# Patient Record
Sex: Female | Born: 1970 | Race: Asian | Hispanic: No | Marital: Single | Smoking: Never smoker
Health system: Southern US, Community
[De-identification: ages and names within clinical notes are randomized; demographics above are authoritative.]

## PROBLEM LIST (undated history)

## (undated) DIAGNOSIS — E7800 Pure hypercholesterolemia, unspecified: Secondary | ICD-10-CM

## (undated) DIAGNOSIS — N979 Female infertility, unspecified: Secondary | ICD-10-CM

## (undated) DIAGNOSIS — N939 Abnormal uterine and vaginal bleeding, unspecified: Secondary | ICD-10-CM

## (undated) DIAGNOSIS — D219 Benign neoplasm of connective and other soft tissue, unspecified: Secondary | ICD-10-CM

## (undated) HISTORY — DX: Female infertility, unspecified: N97.9

## (undated) HISTORY — DX: Abnormal uterine and vaginal bleeding, unspecified: N93.9

## (undated) HISTORY — DX: Pure hypercholesterolemia, unspecified: E78.00

## (undated) HISTORY — DX: Benign neoplasm of connective and other soft tissue, unspecified: D21.9

---

## 2011-11-25 DIAGNOSIS — E7800 Pure hypercholesterolemia, unspecified: Secondary | ICD-10-CM

## 2011-11-25 HISTORY — DX: Pure hypercholesterolemia, unspecified: E78.00

## 2013-08-08 ENCOUNTER — Encounter: Payer: Self-pay | Admitting: Obstetrics and Gynecology

## 2013-08-08 ENCOUNTER — Ambulatory Visit (INDEPENDENT_AMBULATORY_CARE_PROVIDER_SITE_OTHER): Payer: BC Managed Care – PPO | Admitting: Obstetrics and Gynecology

## 2013-08-08 ENCOUNTER — Other Ambulatory Visit: Payer: Self-pay | Admitting: Obstetrics and Gynecology

## 2013-08-08 VITALS — BP 100/60 | HR 76 | Resp 18 | Ht 59.5 in | Wt 108.0 lb

## 2013-08-08 DIAGNOSIS — Z0000 Encounter for general adult medical examination without abnormal findings: Secondary | ICD-10-CM

## 2013-08-08 DIAGNOSIS — Z1231 Encounter for screening mammogram for malignant neoplasm of breast: Secondary | ICD-10-CM

## 2013-08-08 DIAGNOSIS — Z01419 Encounter for gynecological examination (general) (routine) without abnormal findings: Secondary | ICD-10-CM

## 2013-08-08 DIAGNOSIS — N926 Irregular menstruation, unspecified: Secondary | ICD-10-CM

## 2013-08-08 DIAGNOSIS — N939 Abnormal uterine and vaginal bleeding, unspecified: Secondary | ICD-10-CM

## 2013-08-08 LAB — POCT URINALYSIS DIPSTICK
Bilirubin, UA: NEGATIVE
Blood, UA: NEGATIVE
Glucose, UA: NEGATIVE
Nitrite, UA: NEGATIVE

## 2013-08-08 LAB — CBC
HCT: 42.7 % (ref 36.0–46.0)
RDW: 12.8 % (ref 11.5–15.5)
WBC: 9 10*3/uL (ref 4.0–10.5)

## 2013-08-08 NOTE — Progress Notes (Signed)
42 y.o.   Single [place] female   G0P0   here for annual exam.   Bleeding in between periods for more than one year.   Can occur one to two weeks after menses. Menses can occur every 27 - 37 days. Saw a provider in [facility] January 2013 had an ultrasound, and it was normal.  Received an Rx for a medication, one time dose which was repeated 7 days later. Bleeding has still recurred. Denies pain with menses. Sexually active.  Not trying for pregnancy.  Not preventing pregnancy.  Denies pain with intercourse.  Steady partner for 5 years.     Patient's last menstrual period was 07/11/2013.          Sexually active: yes  The current method of family planning is none.    Exercising: not now Last mammogram:  11/2011 normal Last pap smear: 11/2011 neg History of abnormal pap: none Smoking:never Alcohol:no Last colonoscopy: never Last Bone Density:  never Last tetanus shot: not sure Last cholesterol check: 11/2011 slightly  Elevated.  Will recheck in [place] in one month.  Sister is a physician there.  Urine: neg   Family History  Problem Relation Age of Onset  . Diabetes Mother   . Hyperlipidemia Mother   . Hypertension Mother   . Rheum arthritis Mother   . Hyperlipidemia Father     There are no active problems to display for this patient.   Past Medical History  Diagnosis Date  . Abnormal uterine bleeding   . Infertility, female   . Elevated cholesterol 11/2011    History reviewed. No pertinent past surgical history.  Allergies: Review of patient's allergies indicates no known allergies.  Current Outpatient Prescriptions  Medication Sig Dispense Refill  . Acetaminophen (TYLENOL PO) Take by mouth as needed (for headaches).       No current facility-administered medications for this visit.    ROS: Pertinent items are noted in HPI.  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  Will be gone for one month.  Exam:    BP 100/60  Pulse  76  Resp 18  Ht 4' 11.5" (1.511 m)  Wt 108 lb (48.988 kg)  BMI 21.46 kg/m2  LMP 07/11/2013   Wt Readings from Last 3 Encounters:  08/08/13 108 lb (48.988 kg)     Ht Readings from Last 3 Encounters:  08/08/13 4' 11.5" (1.511 m)    General appearance: alert, cooperative and appears stated age Head: Normocephalic, without obvious abnormality, atraumatic Neck: no adenopathy, supple, symmetrical, trachea midline and thyroid not enlarged, symmetric, no tenderness/mass/nodules Lungs: clear to auscultation bilaterally Breasts: Inspection negative, No nipple retraction or dimpling, No nipple discharge or bleeding, No axillary or supraclavicular adenopathy, Normal to palpation without dominant masses Heart: regular rate and rhythm Abdomen: soft, non-tender; bowel sounds normal; no masses,  no organomegaly Extremities: extremities normal, atraumatic, no cyanosis or edema Skin: Skin color, texture, turgor normal. No rashes or lesions Lymph nodes: Cervical, supraclavicular, and axillary nodes normal. No abnormal inguinal nodes palpated Neurologic: Grossly normal   Pelvic: External genitalia:  no lesions              Urethra:  normal appearing urethra with no masses, tenderness or lesions              Bartholins and Skenes: normal                 Vagina: normal appearing vagina with normal color and  discharge, no lesions              Cervix: normal appearance              Pap taken: yes        Bimanual Exam:  Uterus:  uterus is normal size, shape, consistency and nontender                                      Adnexa: normal adnexa in size, nontender and no masses                                      Rectovaginal: Confirms                                      Anus:  normal sphincter tone, no lesions  Assessment  Chronic abnormal uterine bleeding History of infertility.    Planning      [facility] and high risk HPV testing. GC/CT TSH and CBC  today. Return for pelvic ultrasound, sonohysterogram, and endometrial biopsy. return annually or prn     An After Visit Summary was printed and given to the patient.

## 2013-08-08 NOTE — Patient Instructions (Signed)

## 2013-08-11 LAB — IPS N GONORRHOEA AND CHLAMYDIA BY PCR

## 2013-08-18 ENCOUNTER — Encounter: Payer: Self-pay | Admitting: Obstetrics and Gynecology

## 2013-08-18 ENCOUNTER — Ambulatory Visit (INDEPENDENT_AMBULATORY_CARE_PROVIDER_SITE_OTHER): Payer: BC Managed Care – PPO

## 2013-08-18 ENCOUNTER — Ambulatory Visit (INDEPENDENT_AMBULATORY_CARE_PROVIDER_SITE_OTHER): Payer: BC Managed Care – PPO | Admitting: Obstetrics and Gynecology

## 2013-08-18 VITALS — BP 120/70 | HR 60 | Ht 59.5 in | Wt 107.0 lb

## 2013-08-18 DIAGNOSIS — N923 Ovulation bleeding: Secondary | ICD-10-CM

## 2013-08-18 DIAGNOSIS — N939 Abnormal uterine and vaginal bleeding, unspecified: Secondary | ICD-10-CM

## 2013-08-18 DIAGNOSIS — N926 Irregular menstruation, unspecified: Secondary | ICD-10-CM

## 2013-08-18 DIAGNOSIS — N921 Excessive and frequent menstruation with irregular cycle: Secondary | ICD-10-CM

## 2013-08-18 NOTE — Progress Notes (Signed)
Patient ID: Kathryn Sherman, female   DOB: [birth year], 42 y.o.   MRN: [id number]  Subjective  LMP 08/13/13  Patient is here for pelvic ultrasound, sonohysterogram, and endometrial biopsy.  Has had intermenstrual bleeding for one year.   Had a prior normal pelvic ultrasound in [place], [place]  Patient traveling to [place] for one month, next week on 08/23/13.  Objective  See ultrasound below - EMS 6.85 mm with echogenic thickened areas with a feeder vessel suggestive of a polyp.  Ovaries WNL  No free fluid.  Sonohysterogram -   Consent performed.  Speculum placed.  Sterile prep of cervix with betadine.  Cannula placed.  Speculum withdrawn.  Saline injected without difficulty. Images show multiple filling defects, largest 7 mm, suspicious for polyps.  Cystic area noted on filling defect of the right cornual region.   EMB -  Consent performed.  Speculum placed.  Sterile prep of cervix with betadine.  Tenaculum to anterior cervical lip.  Pipelle placed to 8 and 1/2 cm, twice. Tissue obtained and sent to pathology.  Tenaculum and speculum removed.  No complications. Minimal EBL.  Assessment  Intermenstrual bleeding. Possible endometrial polyps.  Plan  Follow up of endometrial biopsy.   I discussed with the patient potential hysteroscopic polypectomy with dilation and curettage as an outpatient procedure.  ACOG handouts were given to the patient. She indicates understanding of our visit today.  Patient has signed a form allowing [place] to contact her friend to give her biopsy results while she is traveling to [place].

## 2013-08-22 ENCOUNTER — Ambulatory Visit
Admission: RE | Admit: 2013-08-22 | Discharge: 2013-08-22 | Disposition: A | Discharge status: Home / Self Care | Payer: BC Managed Care – PPO | Source: Ambulatory Visit | Attending: Obstetrics and Gynecology | Admitting: Obstetrics and Gynecology

## 2013-08-22 DIAGNOSIS — Z1231 Encounter for screening mammogram for malignant neoplasm of breast: Secondary | ICD-10-CM

## 2013-08-29 ENCOUNTER — Other Ambulatory Visit: Payer: Self-pay | Admitting: Obstetrics and Gynecology

## 2013-08-29 DIAGNOSIS — R928 Other abnormal and inconclusive findings on diagnostic imaging of breast: Secondary | ICD-10-CM

## 2013-09-01 ENCOUNTER — Encounter: Payer: Self-pay | Admitting: Obstetrics and Gynecology

## 2013-09-01 ENCOUNTER — Telehealth: Payer: Self-pay | Admitting: Obstetrics and Gynecology

## 2013-09-01 NOTE — Telephone Encounter (Signed)
I left a message for Kathryn Sherman to call back to receive test results for [facility].  Her endometrial biopsy showed a benign polyp and this will need further treatment.  Her screening mammogram showed calcifications of the left breast and will also need further evaluation.  Patient is out of the country for a month and gave written permission to give results to Kathryn Sherman.  Will also send her a letter.

## 2013-09-09 ENCOUNTER — Telehealth: Payer: Self-pay | Admitting: Obstetrics and Gynecology

## 2013-09-09 NOTE — Telephone Encounter (Signed)
Call Documentation    Kathryn Overly, MD at 09/01/2013 12:44 PM    Status: Signed             I left a message for Kathryn Sherman to call back to receive test results for Kathryn Sherman.  Her endometrial biopsy showed a benign polyp and this will need further treatment.  Her screening mammogram showed calcifications of the left breast and will also need further evaluation.  Patient is out of the country for a month and gave written permission to give results to Kathryn Sherman.  Will also send her a letter.

## 2013-09-09 NOTE — Telephone Encounter (Signed)
Message left to return call to Sharifa Bucholz at [phone].  Please give message below from Dr. Silva.   

## 2013-09-09 NOTE — Telephone Encounter (Signed)
Pt friend said he got a letter in the mail for Beya that she needed to make  An appt asap. But didn't know what for.

## 2013-09-13 NOTE — Telephone Encounter (Signed)
Attempted to leave message again. Now mailbox is full, unable to leave message.

## 2013-09-16 NOTE — Telephone Encounter (Signed)
Dr. Edward Jolly,  I spoke with Ms. Castelo, She has f/u appointment scheduled at the [facility].  Appointment scheduled for Polyp follow up, I wasn't sure if it is a removal appointment or just a consult.  Can you place order if it will be for removal so Carolynn can pre-cert?

## 2013-09-16 NOTE — Telephone Encounter (Signed)
Hi French Ana,  Thank you for scheduling follow up for the patient at the [facility] and with me.  Her visit her about her polyp is just a talking visit. Endometrial polyps are removed in the operating room with dilation and curettage.  Thanks for thinking ahead!  [employer]

## 2013-09-26 ENCOUNTER — Encounter: Payer: Self-pay | Admitting: Obstetrics and Gynecology

## 2013-09-29 ENCOUNTER — Other Ambulatory Visit: Payer: Self-pay

## 2013-09-30 ENCOUNTER — Ambulatory Visit
Admission: RE | Admit: 2013-09-30 | Discharge: 2013-09-30 | Disposition: A | Discharge status: Home / Self Care | Payer: BC Managed Care – PPO | Source: Ambulatory Visit | Attending: Obstetrics and Gynecology | Admitting: Obstetrics and Gynecology

## 2013-09-30 DIAGNOSIS — R928 Other abnormal and inconclusive findings on diagnostic imaging of breast: Secondary | ICD-10-CM

## 2013-10-08 ENCOUNTER — Encounter: Payer: Self-pay | Admitting: Obstetrics and Gynecology

## 2013-10-10 ENCOUNTER — Ambulatory Visit: Payer: Self-pay | Admitting: Obstetrics and Gynecology

## 2013-10-10 ENCOUNTER — Ambulatory Visit (INDEPENDENT_AMBULATORY_CARE_PROVIDER_SITE_OTHER): Payer: BC Managed Care – PPO | Admitting: Obstetrics and Gynecology

## 2013-10-10 ENCOUNTER — Encounter: Payer: Self-pay | Admitting: Obstetrics and Gynecology

## 2013-10-10 VITALS — BP 100/56 | HR 70 | Ht 59.5 in | Wt 133.5 lb

## 2013-10-10 DIAGNOSIS — N921 Excessive and frequent menstruation with irregular cycle: Secondary | ICD-10-CM

## 2013-10-10 DIAGNOSIS — N840 Polyp of corpus uteri: Secondary | ICD-10-CM

## 2013-10-10 NOTE — Progress Notes (Signed)
Patient ID: Kathryn Sherman, female   DOB: 11-11-1971, 42 y.o.   MRN: [id number]  Subjective  Patient is here to discuss her chronic abnormal uterine bleeding and endometrial biopsy showing a benign endometrial polyp. Patient is having bleeding every 10 days, not heavy.  Lasts for 3 -4 days.  Denies pain. Declines future childbearing. Not using contraception.    Had a diagnostic mammogram and ultrasound of the left breast and was diagnosed with benign calcifications.  States her mother has a history of endometrial polyps.   Objective  BP  100/56     P 70  Assessment  Metrorrhagia. Endometrial polyp.  I suspect that the patient has more than one.  Plan  I discussed with the patient endometrial polyps and hysteroscopy with dilation and curettage.  We discussed benefits and risks which include but are not limited to bleeding, infection, damage to surrounding organs, and uterine perforation.  We discussed expectations of surgery and recovery. We discussed the possibility of recurrence of endometrial polyps in the future.   Patient will need a preop visit and examination prior to surgery.   I have introduced the patient to our nursing supervisor, who coordinates surgery and pre and post op visits.  20 minutes face to face time with the patient of which over 50% was spent in counseling.   After visit summary to the patient.

## 2013-10-11 ENCOUNTER — Telehealth: Payer: Self-pay | Admitting: Obstetrics and Gynecology

## 2013-10-11 NOTE — Telephone Encounter (Signed)
Patient states she wants 11-01-13 instead of 10-25-13 for surgery.will schedule and call her back.

## 2013-10-11 NOTE — Telephone Encounter (Signed)
Pt calling to talk with Kennon Rounds she states she would like to schedule an appointment for 11-01-13 if possible.

## 2013-10-20 ENCOUNTER — Encounter: Payer: Self-pay | Admitting: Obstetrics and Gynecology

## 2013-10-24 ENCOUNTER — Encounter: Payer: Self-pay | Admitting: Obstetrics and Gynecology

## 2013-10-24 NOTE — Telephone Encounter (Signed)
Pt waiting on call to schedule polyp removal with dr Edward Jolly.

## 2013-10-25 ENCOUNTER — Encounter: Payer: Self-pay | Admitting: Obstetrics and Gynecology

## 2013-10-25 NOTE — Telephone Encounter (Signed)
Surgery was initially scheduled for 11-29-13 since dates patient requested not available.  Called hospital today and able to move date up to 11-22-13 at 1030.  Call to patient and reviewed this information, including surgical instructions and pre/post op dates.  Will mail written instructions.  Routing to provider for final review. Patient agreeable to disposition. Will close encounter

## 2013-11-02 ENCOUNTER — Ambulatory Visit: Payer: Self-pay | Admitting: Obstetrics and Gynecology

## 2013-11-03 ENCOUNTER — Ambulatory Visit (INDEPENDENT_AMBULATORY_CARE_PROVIDER_SITE_OTHER): Payer: BC Managed Care – PPO | Admitting: Obstetrics and Gynecology

## 2013-11-03 ENCOUNTER — Encounter: Payer: Self-pay | Admitting: Obstetrics and Gynecology

## 2013-11-03 VITALS — BP 100/60 | HR 76 | Resp 18 | Wt 114.0 lb

## 2013-11-03 DIAGNOSIS — N921 Excessive and frequent menstruation with irregular cycle: Secondary | ICD-10-CM

## 2013-11-03 NOTE — Patient Instructions (Signed)
I will see you the day of surgery! 

## 2013-11-03 NOTE — Progress Notes (Signed)
Patient ID: Kathryn Sherman, female   DOB: 02/10/1971, 42 y.o.   MRN: [id number]  GYNECOLOGY PROBLEM VISIT  HPI: 42 y.o.   Single  [place]  female   G0P0 with Patient's last menstrual period was 10/10/2013.  LMP started today.   here for  Discussion of surgery. Having irregular vaginal bleeding with bleeding in between menses.   Sonohysterogram on 08/18/13 - showing multiple filling defects suspicious for polyps, largest 7 mm.  No fibroids.  Normal ovaries.  EMB 08/10/13 confirms benign endometrial polyp. No change in health since office last visit.  GYNECOLOGIC HISTORY: Patient's last menstrual period was 10/10/2013. Sexually active:  yes Partner preference: female Contraception:  none Menopausal hormone therapy:   NA Mammogram:  Diagnostic mammogram and left breast ultrasound - benign calcifications of left breast.     Pap:  08/08/13 - WNL, negative high risk HPV.    OB History   Grav Para Term Preterm Abortions TAB SAB Ect Mult Living   0                  Family History  Problem Relation Age of Onset  . Diabetes Mother   . Hyperlipidemia Mother   . Hypertension Mother   . Rheum arthritis Mother   . Hyperlipidemia Father     Patient Active Problem List   Diagnosis Date Noted  . Endometrial polyp 10/10/2013  . Intermenstrual bleeding 08/18/2013    Past Medical History  Diagnosis Date  . Abnormal uterine bleeding   . Infertility, female   . Elevated cholesterol 11/2011    History reviewed. No pertinent past surgical history.  ALLERGIES: Review of patient's allergies indicates no known allergies.  Current Outpatient Prescriptions  Medication Sig Dispense Refill  . Acetaminophen (TYLENOL PO) Take by mouth as needed (for headaches).      Marland Kitchen OVER THE COUNTER MEDICATION voman once a day       No current facility-administered medications for this visit.     ROS:  Pertinent items are noted in HPI.  SOCIAL HISTORY:  Is a Agricultural engineer.   Steady relationship for 5 years.   Family lives in [place].   PHYSICAL EXAMINATION:    BP 100/60  Pulse 76  Resp 18  Wt 114 lb (51.71 kg)  LMP 10/10/2013   Wt Readings from Last 3 Encounters:  11/03/13 114 lb (51.71 kg)  10/10/13 133 lb 8 oz (60.555 kg)  08/18/13 107 lb (48.535 kg)     Ht Readings from Last 3 Encounters:  10/10/13 4' 11.5" (1.511 m)  08/18/13 4' 11.5" (1.511 m)  08/08/13 4' 11.5" (1.511 m)    General appearance: alert, cooperative and appears stated age Head: Normocephalic, without obvious abnormality, atraumatic Neck: no adenopathy, supple, symmetrical, trachea midline and thyroid not enlarged, symmetric, no tenderness/mass/nodules Lungs: clear to auscultation bilaterally Heart: regular rate and rhythm Abdomen: soft, non-tender; no masses,  no organomegaly Extremities: extremities normal, atraumatic, no cyanosis or edema Skin: Skin color, texture, turgor normal. No rashes or lesions Lymph nodes: Cervical, supraclavicular, and axillary nodes normal. No abnormal inguinal nodes palpated Neurologic: Grossly normal  Pelvic: External genitalia:  no lesions              Urethra:  normal appearing urethra with no masses, tenderness or lesions              Bartholins and Skenes: normal                 Vagina: normal appearing vagina with  normal color and discharge, no lesions              Cervix: normal appearance.  Menstrual flow noted.         Bimanual Exam:  Uterus:  uterus is normal size, shape, consistency and nontender, retroverted.                                      Adnexa: normal adnexa in size, nontender and no masses                                      Rectovaginal: Confirms                                      Anus:  normal sphincter tone, no lesions  ASSESSMENT  Metrorrhagia. Endometrial polyps.  PLAN  Proceed with hysteroscopy with polypectomy and dilation and curettage.  Risks, benefits, and alternatives reviewed with the patient who wishes to proceed. Surgical expectations  and recovery also discussed. Patient indicates understanding of surgery.   An After Visit Summary was printed and given to the patient.  15 minutes face to face time of which over 50% was spent in counseling.

## 2013-11-10 ENCOUNTER — Encounter (HOSPITAL_COMMUNITY): Payer: Self-pay

## 2013-11-21 NOTE — H&P (Signed)
Brook E Amundson de Gwenevere Ghazi, MD at 11/03/2013  1:13 PM      Status: Signed            Patient ID: Kathryn Sherman, female   DOB: 1971/06/10, 42 y.o.   MRN: [id number]  GYNECOLOGY PROBLEM VISIT  HPI: 42 y.o.   Single  [place]  female    G0P0 with Patient's last menstrual period was 10/10/2013.  LMP started today.    here for  Discussion of surgery. Having irregular vaginal bleeding with bleeding in between menses.    Sonohysterogram on 08/18/13 - showing multiple filling defects suspicious for polyps, largest 7 mm.  No fibroids.  Normal ovaries.   EMB 08/10/13 confirms benign endometrial polyp. No change in health since office last visit.  GYNECOLOGIC HISTORY: Patient's last menstrual period was 10/10/2013. Sexually active:  yes Partner preference: female Contraception:  none Menopausal hormone therapy:   NA Mammogram:  Diagnostic mammogram and left breast ultrasound - benign calcifications of left breast.      Pap:  08/08/13 - WNL, negative high risk HPV.      OB History     Grav  Para  Term  Preterm  Abortions  TAB  SAB  Ect  Mult  Living     0                               Family History   Problem  Relation  Age of Onset   .  Diabetes  Mother     .  Hyperlipidemia  Mother     .  Hypertension  Mother     .  Rheum arthritis  Mother     .  Hyperlipidemia  Father         Patient Active Problem List     Diagnosis  Date Noted   .  Endometrial polyp  10/10/2013   .  Intermenstrual bleeding  08/18/2013       Past Medical History   Diagnosis  Date   .  Abnormal uterine bleeding     .  Infertility, female     .  Elevated cholesterol  11/2011     History reviewed. No pertinent past surgical history.  ALLERGIES: Review of patient's allergies indicates no known allergies.    Current Outpatient Prescriptions   Medication  Sig  Dispense  Refill   .  Acetaminophen (TYLENOL PO)  Take by mouth as needed (for headaches).         Marland Kitchen  OVER THE COUNTER MEDICATION  voman  once a day            No current facility-administered medications for this visit.      ROS:  Pertinent items are noted in HPI.  SOCIAL HISTORY:  Is a Agricultural engineer.   Steady relationship for 5 years.  Family lives in [place].   PHYSICAL EXAMINATION:    BP 100/60  Pulse 76  Resp 18  Wt 114 lb (51.71 kg)  LMP 10/10/2013    Wt Readings from Last 3 Encounters:   11/03/13  114 lb (51.71 kg)   10/10/13  133 lb 8 oz (60.555 kg)   08/18/13  107 lb (48.535 kg)       Ht Readings from Last 3 Encounters:   10/10/13  4' 11.5" (1.511 m)   08/18/13  4' 11.5" (1.511 m)   08/08/13  4' 11.5" (1.511 m)  General appearance: alert, cooperative and appears stated age Head: Normocephalic, without obvious abnormality, atraumatic Neck: no adenopathy, supple, symmetrical, trachea midline and thyroid not enlarged, symmetric, no tenderness/mass/nodules Lungs: clear to auscultation bilaterally Heart: regular rate and rhythm Abdomen: soft, non-tender; no masses,  no organomegaly Extremities: extremities normal, atraumatic, no cyanosis or edema Skin: Skin color, texture, turgor normal. No rashes or lesions Lymph nodes: Cervical, supraclavicular, and axillary nodes normal. No abnormal inguinal nodes palpated Neurologic: Grossly normal  Pelvic: External genitalia:  no lesions              Urethra:  normal appearing urethra with no masses, tenderness or lesions              Bartholins and Skenes: normal                  Vagina: normal appearing vagina with normal color and discharge, no lesions              Cervix: normal appearance.  Menstrual flow noted.         Bimanual Exam:  Uterus:  uterus is normal size, shape, consistency and nontender, retroverted.                                      Adnexa: normal adnexa in size, nontender and no masses                                      Rectovaginal: Confirms                                      Anus:  normal sphincter tone, no  lesions  ASSESSMENT  Metrorrhagia. Endometrial polyps.  PLAN  Proceed with hysteroscopy with polypectomy and dilation and curettage.  Risks, benefits, and alternatives reviewed with the patient who wishes to proceed. Surgical expectations and recovery also discussed. Patient indicates understanding of surgery.    An After Visit Summary was printed and given to the patient.  15 minutes face to face time of which over 50% was spent in counseling.

## 2013-11-22 ENCOUNTER — Ambulatory Visit (HOSPITAL_COMMUNITY): Payer: BC Managed Care – PPO | Admitting: Anesthesiology

## 2013-11-22 ENCOUNTER — Encounter (HOSPITAL_COMMUNITY)
Admission: RE | Disposition: A | Discharge status: Home / Self Care | Payer: Self-pay | Source: Ambulatory Visit | Attending: Obstetrics and Gynecology

## 2013-11-22 ENCOUNTER — Encounter (HOSPITAL_COMMUNITY): Payer: BC Managed Care – PPO | Admitting: Anesthesiology

## 2013-11-22 ENCOUNTER — Ambulatory Visit (HOSPITAL_COMMUNITY)
Admission: RE | Admit: 2013-11-22 | Discharge: 2013-11-22 | Disposition: A | Discharge status: Home / Self Care | Payer: BC Managed Care – PPO | Source: Ambulatory Visit | Attending: Obstetrics and Gynecology | Admitting: Obstetrics and Gynecology

## 2013-11-22 ENCOUNTER — Encounter (HOSPITAL_COMMUNITY): Payer: Self-pay | Admitting: Anesthesiology

## 2013-11-22 DIAGNOSIS — N840 Polyp of corpus uteri: Secondary | ICD-10-CM

## 2013-11-22 DIAGNOSIS — N921 Excessive and frequent menstruation with irregular cycle: Secondary | ICD-10-CM | POA: Insufficient documentation

## 2013-11-22 HISTORY — PX: DILATATION & CURRETTAGE/HYSTEROSCOPY WITH RESECTOCOPE: SHX5572

## 2013-11-22 LAB — CBC
HCT: 40.8 % (ref 36.0–46.0)
Hemoglobin: 13.9 g/dL (ref 12.0–15.0)
MCH: 30.1 pg (ref 26.0–34.0)
MCV: 88.3 fL (ref 78.0–100.0)
Platelets: 269 10*3/uL (ref 150–400)
RBC: 4.62 MIL/uL (ref 3.87–5.11)
WBC: 7 10*3/uL (ref 4.0–10.5)

## 2013-11-22 SURGERY — DILATATION & CURETTAGE/HYSTEROSCOPY WITH RESECTOCOPE
Anesthesia: General | Site: Uterus

## 2013-11-22 MED ORDER — KETOROLAC TROMETHAMINE 30 MG/ML IJ SOLN
INTRAMUSCULAR | Status: DC | PRN
  Administered 2013-11-22: 30 mg via INTRAVENOUS

## 2013-11-22 MED ORDER — IBUPROFEN 800 MG PO TABS: 800.0000 mg | ORAL_TABLET | Freq: Three times a day (TID) | ORAL | Status: DC | PRN

## 2013-11-22 MED ORDER — MIDAZOLAM HCL 2 MG/2ML IJ SOLN
INTRAMUSCULAR | Status: AC
  Filled 2013-11-22: qty 2

## 2013-11-22 MED ORDER — PROPOFOL 10 MG/ML IV BOLUS
INTRAVENOUS | Status: DC | PRN
  Administered 2013-11-22: 150 mg via INTRAVENOUS

## 2013-11-22 MED ORDER — MEPERIDINE HCL 25 MG/ML IJ SOLN: 6.2500 mg | INTRAMUSCULAR | Status: DC | PRN

## 2013-11-22 MED ORDER — FENTANYL CITRATE 0.05 MG/ML IJ SOLN
INTRAMUSCULAR | Status: AC
  Filled 2013-11-22: qty 2

## 2013-11-22 MED ORDER — LACTATED RINGERS IV SOLN
INTRAVENOUS | Status: DC
  Administered 2013-11-22 (×2): via INTRAVENOUS

## 2013-11-22 MED ORDER — LIDOCAINE HCL (CARDIAC) 20 MG/ML IV SOLN
INTRAVENOUS | Status: AC
  Filled 2013-11-22: qty 5

## 2013-11-22 MED ORDER — MIDAZOLAM HCL 2 MG/2ML IJ SOLN
INTRAMUSCULAR | Status: DC | PRN
  Administered 2013-11-22: 1 mg via INTRAVENOUS

## 2013-11-22 MED ORDER — LACTATED RINGERS IV SOLN: INTRAVENOUS | Status: DC

## 2013-11-22 MED ORDER — METOCLOPRAMIDE HCL 5 MG/ML IJ SOLN: 10.0000 mg | Freq: Once | INTRAMUSCULAR | Status: DC | PRN

## 2013-11-22 MED ORDER — LIDOCAINE HCL 1 % IJ SOLN
INTRAMUSCULAR | Status: DC | PRN
  Administered 2013-11-22: 10 mL

## 2013-11-22 MED ORDER — ONDANSETRON HCL 4 MG/2ML IJ SOLN
INTRAMUSCULAR | Status: DC | PRN
  Administered 2013-11-22: 4 mg via INTRAVENOUS

## 2013-11-22 MED ORDER — PROPOFOL 10 MG/ML IV EMUL
INTRAVENOUS | Status: AC
  Filled 2013-11-22: qty 20

## 2013-11-22 MED ORDER — KETOROLAC TROMETHAMINE 30 MG/ML IJ SOLN: 15.0000 mg | Freq: Once | INTRAMUSCULAR | Status: DC | PRN

## 2013-11-22 MED ORDER — KETOROLAC TROMETHAMINE 30 MG/ML IJ SOLN
INTRAMUSCULAR | Status: AC
  Filled 2013-11-22: qty 1

## 2013-11-22 MED ORDER — LIDOCAINE HCL 1 % IJ SOLN
INTRAMUSCULAR | Status: AC
  Filled 2013-11-22: qty 20

## 2013-11-22 MED ORDER — FENTANYL CITRATE 0.05 MG/ML IJ SOLN: 25.0000 ug | INTRAMUSCULAR | Status: DC | PRN

## 2013-11-22 MED ORDER — FENTANYL CITRATE 0.05 MG/ML IJ SOLN
INTRAMUSCULAR | Status: DC | PRN
  Administered 2013-11-22 (×2): 50 ug via INTRAVENOUS

## 2013-11-22 MED ORDER — PHENYLEPHRINE HCL 10 MG/ML IJ SOLN
INTRAMUSCULAR | Status: DC | PRN
  Administered 2013-11-22: 40 ug via INTRAVENOUS

## 2013-11-22 MED ORDER — LIDOCAINE HCL (CARDIAC) 20 MG/ML IV SOLN
INTRAVENOUS | Status: DC | PRN
  Administered 2013-11-22: 30 mg via INTRAVENOUS
  Administered 2013-11-22: 40 mg via INTRAVENOUS

## 2013-11-22 MED ORDER — ONDANSETRON HCL 4 MG/2ML IJ SOLN
INTRAMUSCULAR | Status: AC
  Filled 2013-11-22: qty 2

## 2013-11-22 MED ORDER — PHENYLEPHRINE 40 MCG/ML (10ML) SYRINGE FOR IV PUSH (FOR BLOOD PRESSURE SUPPORT)
PREFILLED_SYRINGE | INTRAVENOUS | Status: AC
  Filled 2013-11-22: qty 5

## 2013-11-22 SURGICAL SUPPLY — 17 items
CANISTER SUCT 3000ML (MISCELLANEOUS) ×2 IMPLANT
CATH ROBINSON RED A/P 16FR (CATHETERS) ×2 IMPLANT
CLOTH BEACON ORANGE TIMEOUT ST (SAFETY) ×2 IMPLANT
CONTAINER PREFILL 10% NBF 60ML (FORM) ×4 IMPLANT
DRSG TELFA 3X8 NADH (GAUZE/BANDAGES/DRESSINGS) ×2 IMPLANT
ELECT REM PT RETURN 9FT ADLT (ELECTROSURGICAL)
ELECTRODE REM PT RTRN 9FT ADLT (ELECTROSURGICAL) IMPLANT
GLOVE BIO SURGEON STRL SZ 6.5 (GLOVE) ×2 IMPLANT
GLOVE BIOGEL PI IND STRL 7.0 (GLOVE) ×1 IMPLANT
GLOVE BIOGEL PI INDICATOR 7.0 (GLOVE) ×1
GOWN STRL REIN XL XLG (GOWN DISPOSABLE) ×4 IMPLANT
LOOP ANGLED CUTTING 22FR (CUTTING LOOP) ×2 IMPLANT
NEEDLE SPNL 20GX3.5 QUINCKE YW (NEEDLE) IMPLANT
PACK HYSTEROSCOPY LF (CUSTOM PROCEDURE TRAY) ×2 IMPLANT
PAD OB MATERNITY 4.3X12.25 (PERSONAL CARE ITEMS) ×2 IMPLANT
TOWEL OR 17X24 6PK STRL BLUE (TOWEL DISPOSABLE) ×4 IMPLANT
WATER STERILE IRR 1000ML POUR (IV SOLUTION) ×2 IMPLANT

## 2013-11-22 NOTE — Discharge Instructions (Signed)
Hysteroscopy Hysteroscopy is a procedure used for looking inside the womb (uterus). It may be done for many different reasons, including: To evaluate abnormal bleeding, fibroid (benign, noncancerous) tumors, polyps, scar tissue (adhesions), and possibly cancer of the uterus. To look for lumps (tumors) and other uterine growths. To look for causes of why a woman cannot get pregnant (infertility), causes of recurrent loss of pregnancy (miscarriages), or a lost intrauterine device (IUD). To perform a sterilization by blocking the fallopian tubes from inside the uterus. A hysteroscopy should be done right after a menstrual period to be sure you are not pregnant. LET YOUR CAREGIVER KNOW ABOUT:  Allergies. Medicines taken, including herbs, eyedrops, over-the-counter medicines, and creams. Use of steroids (by mouth or creams). Previous problems with anesthetics or numbing medicines. History of bleeding or blood problems. History of blood clots. Possibility of pregnancy, if this applies. Previous surgery. Other health problems. RISKS AND COMPLICATIONS  Putting a hole in the uterus. Excessive bleeding. Infection. Damage to the cervix. Injury to other organs. Allergic reaction to medicines. Too much fluid used in the uterus for the procedure. BEFORE THE PROCEDURE  Do not take aspirin or blood thinners for a week before the procedure, or as directed. It can cause bleeding. Arrive at least 60 minutes before the procedure or as directed to read and sign the necessary forms. Arrange for someone to take you home after the procedure. If you smoke, do not smoke for 2 weeks before the procedure. PROCEDURE  Your caregiver may give you medicine to relax you. He or she may also give you a medicine that numbs the area around the cervix (local anesthetic) or a medicine that makes you sleep (general anesthesia). Sometimes, a medicine is placed in the cervix the day before the procedure. This medicine makes  the cervix have a larger opening (dilate). This makes it easier for the instrument to be inserted into the uterus. A small instrument (hysteroscope) is inserted through the vagina into the uterus. This instrument is similar to a pencil-sized telescope with a light. During the procedure, air or a liquid is put into the uterus, which allows the surgeon to see better. Sometimes, tissue is gently scraped from inside the uterus. These tissue samples are sent to a specialist who looks at tissue samples (pathologist). The pathologist will give a report to your caregiver. This will help your caregiver decide if further treatment is necessary. The report will also help your caregiver decide on the best treatment if the test comes back abnormal. AFTER THE PROCEDURE  If you had a general anesthetic, you may be groggy for a couple hours after the procedure. If you had a local anesthetic, you will be advised to rest at the surgical center or caregiver's office until you are stable and feel ready to go home. You may have some cramping for a couple days. You may have bleeding, which varies from light spotting for a few days to menstrual-like bleeding for up to 3 to 7 days. This is normal. Have someone take you home. FINDING OUT THE RESULTS OF YOUR TEST Not all test results are available during your visit. If your test results are not back during the visit, make an appointment with your caregiver to find out the results. Do not assume everything is normal if you have not heard from your caregiver or the medical facility. It is important for you to follow up on all of your test results. HOME CARE INSTRUCTIONS  Do not drive for 24 hours  or as instructed. Only take over-the-counter or prescription medicines for pain, discomfort, or fever as directed by your caregiver. Do not take aspirin. It can cause or aggravate bleeding. Do not drive or drink alcohol while taking pain medicine. You may resume your usual diet. Do  not use tampons, douche, or have sexual intercourse for 2 weeks, or as advised by your caregiver. Rest and sleep for the first 24 to 48 hours. Take your temperature twice a day for 4 to 5 days. Write it down. Give these temperatures to your caregiver if they are abnormal (above 98.6 F or 37.0 C). Take medicines your caregiver has ordered as directed. Follow your caregiver's advice regarding diet, exercise, lifting, driving, and general activities. Take showers instead of baths for 2 weeks, or as recommended by your caregiver. If you develop constipation: Take a mild laxative with the advice of your caregiver. Eat bran foods. Drink enough water and fluids to keep your urine clear or pale yellow. Try to have someone with you or available to you for the first 24 to 48 hours, especially if you had a general anesthetic. Make sure you and your family understand everything about your operation and recovery. Follow your caregiver's advice regarding follow-up appointments and Pap smears. SEEK MEDICAL CARE IF:  You feel dizzy or lightheaded. You feel sick to your stomach (nauseous). You develop abnormal vaginal discharge. You develop a rash. You have an abnormal reaction or allergy to your medicine. You need stronger pain medicine. SEEK IMMEDIATE MEDICAL CARE IF:  Bleeding is heavier than a normal menstrual period or you have blood clots. You have an oral temperature above 102 F (38.9 C), not controlled by medicine. You have increasing cramps or pains not relieved with medicine. You develop belly (abdominal) pain that does not seem to be related to the same area of earlier cramping and pain. You pass out. You develop pain in the tops of your shoulders (shoulder strap areas). You develop shortness of breath. MAKE SURE YOU:  Understand these instructions. Will watch your condition. Will get help right away if you are not doing well or get worse. Document Released: 02/16/2001 Document Revised:  02/02/2012 Document Reviewed: 06/09/2013 [facility] Patient Information 2014 [place], [place]. Dilation and Curettage or Vacuum Curettage Dilation and curettage (D&C) and vacuum curettage are minor procedures. A D&C involves stretching (dilation) the cervix and scraping (curettage) the inside lining of the womb (uterus). During a D&C, tissue is gently scraped from the inside lining of the uterus. During a vacuum curettage, the lining and tissue in the uterus are removed with the use of gentle suction.  Curettage may be performed to either diagnose or treat a problem. As a diagnostic procedure, curettage is performed to examine tissues from the uterus. A diagnostic curettage may be performed for the following symptoms:   Irregular bleeding in the uterus.   Bleeding with the development of clots.   Spotting between menstrual periods.   Prolonged menstrual periods.   Bleeding after menopause.   No menstrual period (amenorrhea).   A change in size and shape of the uterus.  As a treatment procedure, curettage may be performed for the following reasons:   Removal of an IUD (intrauterine device).   Removal of retained placenta after giving birth. Retained placenta can cause an infection or bleeding severe enough to require transfusions.   Abortion.   Miscarriage.   Removal of polyps inside the uterus.   Removal of uncommon types of noncancerous lumps (fibroids).  LET YOUR  HEALTH CARE PROVIDER KNOW ABOUT:   Any allergies you have.   All medicines you are taking, including vitamins, herbs, eye drops, creams, and over-the-counter medicines.   Previous problems you or members of your family have had with the use of anesthetics.   Any blood disorders you have.   Previous surgeries you have had.   Medical conditions you have. RISKS AND COMPLICATIONS  Generally, this is a safe procedure. However, as with any procedure, complications can occur. Possible complications  include:  Excessive bleeding.   Infection of the uterus.   Damage to the cervix.   Development of scar tissue (adhesions) inside the uterus, later causing abnormal amounts of menstrual bleeding.   Complications from the general anesthetic, if a general anesthetic is used.   Putting a hole (perforation) in the uterus. This is rare.  BEFORE THE PROCEDURE   Eat and drink before the procedure only as directed by your health care provider.   Arrange for someone to take you home.  PROCEDURE  This procedure usually takes about 15 30 minutes.  You will be given one of the following:  A medicine that numbs the area in and around the cervix (local anesthetic).   A medicine to make you sleep through the procedure (general anesthetic).  You will lie on your back with your legs in stirrups.   A warm metal or plastic instrument (speculum) will be placed in your vagina to keep it open and to allow the health care provider to see the cervix.  There are two ways in which your cervix can be softened and dilated. These include:   Taking a medicine.   Having thin rods (laminaria) inserted into your cervix.   A curved tool (curette) will be used to scrape cells from the inside lining of the uterus. In some cases, gentle suction is applied with the curette. The curette will then be removed.  AFTER THE PROCEDURE   You will rest in the recovery area until you are stable and are ready to go home.   You may feel sick to your stomach (nauseous) or throw up (vomit) if you were given a general anesthetic.   You may have a sore throat if a tube was placed in your throat during general anesthesia.   You may have light cramping and bleeding. This may last for 2 days to 2 weeks after the procedure.   Your uterus needs to make a new lining after the procedure. This may make your next period late. Document Released: 11/10/2005 Document Revised: 07/13/2013 Document Reviewed:  06/09/2013 [facility] Patient Information 2014 [place], [place].  Post Anesthesia Home Care Instructions  Activity: Get plenty of rest for the remainder of the day. A responsible adult should stay with you for 24 hours following the procedure.  For the next 24 hours, DO NOT: -Drive a car -Advertising copywriter -Drink alcoholic beverages -Take any medication unless instructed by your physician -Make any legal decisions or sign important papers.  Meals: Start with liquid foods such as gelatin or soup. Progress to regular foods as tolerated. Avoid greasy, spicy, heavy foods. If nausea and/or vomiting occur, drink only clear liquids until the nausea and/or vomiting subsides. Call your physician if vomiting continues.  Special Instructions/Symptoms: Your throat may feel dry or sore from the anesthesia or the breathing tube placed in your throat during surgery. If this causes discomfort, gargle with warm salt water. The discomfort should disappear within 24 hours.

## 2013-11-22 NOTE — Progress Notes (Signed)
Update to History and Physical  Patient traveling to [place] on 11/25/12.  Mother is ill and in ICU.  Patient wishes to proceed with surgery.  Will have access to medical care if develops any problems while in her home country.  Her sister is a Development worker, community.   LMP 11/12/13  Serum HCG negative.  Patient examined.  OK to proceed with surgery - hysterectomy polypectomy and dilation and curettage.

## 2013-11-22 NOTE — Transfer of Care (Signed)
Immediate Anesthesia Transfer of Care Note  Patient: Kathryn Sherman  Procedure(s) Performed: Procedure(s): DILATATION & CURETTAGE/HYSTEROSCOPY WITH RESECTOCOPE (N/A)  Patient Location: PACU  Anesthesia Type:General  Level of Consciousness: awake, alert , oriented and patient cooperative  Airway & Oxygen Therapy: Patient Spontanous Breathing and Patient connected to nasal cannula oxygen  Post-op Assessment: Report given to PACU RN and Post -op Vital signs reviewed and stable  Post vital signs: Reviewed and stable  Complications: No apparent anesthesia complications

## 2013-11-22 NOTE — Anesthesia Postprocedure Evaluation (Signed)
  Anesthesia Post-op Note  Anesthesia Post Note  Patient: Kathryn Sherman  Procedure(s) Performed: Procedure(s) (LRB): DILATATION & CURETTAGE/HYSTEROSCOPY WITH RESECTOCOPE (N/A)  Anesthesia type: General  Patient location: PACU  Post pain: Pain level controlled  Post assessment: Post-op Vital signs reviewed  Last Vitals:  Filed Vitals:   11/22/13 1245  BP: 103/58  Pulse:   Temp:   Resp:     Post vital signs: Reviewed  Level of consciousness: sedated  Complications: No apparent anesthesia complications

## 2013-11-22 NOTE — Anesthesia Preprocedure Evaluation (Addendum)
Anesthesia Evaluation  Patient identified by MRN, date of birth, ID band Patient awake    Reviewed: Allergy & Precautions, H&P , NPO status , Patient's Chart, lab work & pertinent test results, reviewed documented beta blocker date and time   History of Anesthesia Complications Negative for: history of anesthetic complications  Airway Mallampati: I TM Distance: >3 FB Neck ROM: full    Dental  (+) Teeth Intact   Pulmonary neg pulmonary ROS,  breath sounds clear to auscultation  Pulmonary exam normal       Cardiovascular negative cardio ROS  Rhythm:regular Rate:Normal     Neuro/Psych negative neurological ROS  negative psych ROS   GI/Hepatic negative GI ROS, Neg liver ROS,   Endo/Other  negative endocrine ROS  Renal/GU negative Renal ROS  Female GU complaint     Musculoskeletal   Abdominal   Peds  Hematology negative hematology ROS (+)   Anesthesia Other Findings   Reproductive/Obstetrics negative OB ROS                          Anesthesia Physical Anesthesia Plan  ASA: I  Anesthesia Plan: General LMA   Post-op Pain Management:    Induction:   Airway Management Planned:   Additional Equipment:   Intra-op Plan:   Post-operative Plan:   Informed Consent: I have reviewed the patients History and Physical, chart, labs and discussed the procedure including the risks, benefits and alternatives for the proposed anesthesia with the patient or authorized representative who has indicated his/her understanding and acceptance.   Dental Advisory Given  Plan Discussed with: CRNA and Surgeon  Anesthesia Plan Comments:        Anesthesia Quick Evaluation

## 2013-11-22 NOTE — Brief Op Note (Signed)
11/22/2013  11:30 AM  PATIENT:  Kathryn Sherman  42 y.o. female  PRE-OPERATIVE DIAGNOSIS:  metorrhagia, endometrial polyp  POST-OPERATIVE DIAGNOSIS:  endometrial polyps  PROCEDURE:  Procedure(s): DILATATION & CURETTAGE/HYSTEROSCOPY WITH RESECTOCOPE (N/A), POLYPECTOMY  SURGEON:  Surgeon(s) and Role:    * Brook E Amundson de Gwenevere Ghazi, MD - Primary  PHYSICIAN ASSISTANT:   ASSISTANTS: none   ANESTHESIA:   paracervical block and  LMA  EBL:  Total I/O In: 1300 [I.V.:1300] Out: -   BLOOD ADMINISTERED:none  DRAINS: none   LOCAL MEDICATIONS USED:  LIDOCAINE  and Amount:  10 ml  SPECIMEN:  Source of Specimen:   Endometrial curettings, endometrial polyps  DISPOSITION OF SPECIMEN:  PATHOLOGY  COUNTS:  YES  TOURNIQUET:  * No tourniquets in log *  DICTATION: .Other Dictation: Dictation Number    PLAN OF CARE: Discharge to home after PACU  PATIENT DISPOSITION:  PACU - hemodynamically stable.   Delay start of Pharmacological VTE agent (>24hrs) due to surgical blood loss or risk of bleeding: not applicable

## 2013-11-23 ENCOUNTER — Telehealth: Payer: Self-pay | Admitting: Obstetrics and Gynecology

## 2013-11-23 ENCOUNTER — Encounter: Payer: Self-pay | Admitting: Obstetrics and Gynecology

## 2013-11-23 ENCOUNTER — Encounter (HOSPITAL_COMMUNITY): Payer: Self-pay | Admitting: Obstetrics and Gynecology

## 2013-11-23 NOTE — Telephone Encounter (Signed)
Phone call to patient regarding pathology report.  Patient states doing well.  No pain.  No bleeding today.  Path from hysteroscopic polypectomy and dilation and curettage showed benign endometrial polyp.

## 2013-11-23 NOTE — Op Note (Signed)
Kathryn Sherman, Kathryn Sherman NO.:  [id number]  MEDICAL RECORD NO.:  [id number]  LOCATION:  [facility]                          FACILITY:  [facility]  PHYSICIAN:  Randye Lobo, M.D.   DATE OF BIRTH:  01-04-1971  DATE OF PROCEDURE:  11/22/2013 DATE OF DISCHARGE:  11/22/2013                              OPERATIVE REPORT   PREOPERATIVE DIAGNOSES:  Metrorrhagia, endometrial polyps.  POSTOPERATIVE DIAGNOSES:  Metrorrhagia, endometrial polyps.  PROCEDURES:  Hysteroscopy with polypectomy, dilation and curettage.  SURGEON:  Randye Lobo, MD.  ASSISTANT:  None.  ANESTHESIA:  LMA, paracervical block with 10 mL 1% lidocaine.  IV FLUIDS:  1300 mL Ringer's lactate.  EBL:  Minimal.  URINE OUTPUT:  None.  COMPLICATIONS:  None.  GLYCINE DEFICIT:  200 mL.  INDICATIONS FOR THE PROCEDURE:  The patient is a 42 year old, gravida 0 para 0 [place] female with a last menstrual period November 12, 2013, who presents with irregular vaginal bleeding.  The patient has been experiencing prolonged episodes of bleeding in between her menstruation. Sonohysterogram performed in the office on August 18, 2013, showed multiple filling defects suspicious for polyps.  Endometrial biopsy confirmed benign endometrial polyps.  The patient had no known uterine fibroids and her ovaries were unremarkable on ultrasound exam.  A plan is now made to proceed with hysteroscopy with D and C and polypectomy after risks, benefits, and alternatives are reviewed.  FINDINGS:  Exam under anesthesia revealed a slightly enlarged uterus consistent with approximately 5-[redacted] week gestation.  The uterus was mid position.  No adnexal masses were appreciated.  Hysteroscopy demonstrated the uterine cavity which was filled with polypoid areas.  The right tubal ostial region could not be visualized well.  The left tubal ostial region appeared to have web-like endometrium covering over the area.  There was no evidence of  any fibroids in the uterine cavity.  SPECIMENS:  Endometrial curettings were sent to pathology separately from the endometrial polyps.  DESCRIPTION OF PROCEDURE:  The patient was reidentified in the preoperative hold area.  The patient received PAS stockings for DVT prophylaxis.  In the operating room, an LMA anesthetic was administered and the patient was then placed in the dorsal lithotomy position with Allen stirrups.  The lower abdomen, vagina, and perineum were then sterilely prepped and the patient was catheterized with a red rubber catheter.  No urine was obtained.  The patient was then sterilely draped.  An exam under anesthesia was performed.  A speculum was placed inside the vagina.  A single-tooth tenaculum was placed on the anterior cervical lip.  A paracervical block with 10 mL of 1% lidocaine was performed in standard fashion.  The uterus was sounded to 7 cm.  The cervix was then dilated to a #21 Pratt dilator.  The diagnostic hysteroscope was inserted under the continuous infusion of glycine solution.  The findings are as noted above.  The hysteroscope was withdrawn and the cervix was further dilated to a #31 Pratt dilator. The resectoscope was then inserted under the continuous infusion of glycine.  Initially, the visualization was poor.  The resectoscope was removed and a sharp and then a serrated curette were used to curette  the endometrial cavity to begin removing some of the polypoid areas in the endometrium.  This specimen was set aside as endometrial curettings.  The resectoscope was reintroduced at this time again under the infusion of glycine.  At this time, visualization was much improved.  Any remaining polypoid areas were resected with the resectoscopic loop on a setting of 60 watts.  The specimen pieces were removed sequentially and sent together to pathology as endometrial polyp.  The resectoscope with a loop was then used to coagulate any minor  bleeding from vessels in the endometrium.  Hemostasis was good at this time.  All of the vaginal instruments were removed.  The anterior cervical lip needed to be clamped briefly with a ring forceps at the anterior cervical lip with a tenaculum had been placed because it was oozing slightly.  This did provide good hemostasis.  The patient was cleansed with Betadine, awakened, and sent to the recovery room in stable condition.  There were no complications to the procedure.  All needle, instrument, and sponge counts were correct.     Randye Lobo, M.D.     BES/MEDQ  D:  11/22/2013  T:  11/23/2013  Job:  [id number]

## 2013-11-23 NOTE — Telephone Encounter (Signed)
Pt cx appt for 2 week post op reck . Pt is going to [place] and will call when she returns to schedule appt.

## 2013-12-07 ENCOUNTER — Ambulatory Visit: Payer: BC Managed Care – PPO | Admitting: Obstetrics and Gynecology

## 2014-08-09 ENCOUNTER — Other Ambulatory Visit: Payer: Self-pay | Admitting: Obstetrics and Gynecology

## 2014-08-09 ENCOUNTER — Encounter: Payer: Self-pay | Admitting: Obstetrics and Gynecology

## 2014-08-09 ENCOUNTER — Ambulatory Visit (INDEPENDENT_AMBULATORY_CARE_PROVIDER_SITE_OTHER): Payer: BC Managed Care – PPO | Admitting: Obstetrics and Gynecology

## 2014-08-09 ENCOUNTER — Ambulatory Visit: Payer: BC Managed Care – PPO | Admitting: Obstetrics and Gynecology

## 2014-08-09 VITALS — BP 100/66 | HR 74 | Resp 14 | Ht 59.0 in | Wt 114.2 lb

## 2014-08-09 DIAGNOSIS — Z0000 Encounter for general adult medical examination without abnormal findings: Secondary | ICD-10-CM

## 2014-08-09 DIAGNOSIS — Z01419 Encounter for gynecological examination (general) (routine) without abnormal findings: Secondary | ICD-10-CM

## 2014-08-09 DIAGNOSIS — Z1231 Encounter for screening mammogram for malignant neoplasm of breast: Secondary | ICD-10-CM

## 2014-08-09 LAB — POCT URINALYSIS DIPSTICK
Bilirubin, UA: NEGATIVE
Glucose, UA: NEGATIVE
KETONES UA: NEGATIVE
Leukocytes, UA: NEGATIVE
Nitrite, UA: NEGATIVE
PH UA: 7
PROTEIN UA: NEGATIVE
RBC UA: NEGATIVE
Urobilinogen, UA: NEGATIVE

## 2014-08-09 LAB — COMPREHENSIVE METABOLIC PANEL
ALT: 12 U/L (ref 0–35)
AST: 14 U/L (ref 0–37)
Albumin: 4.4 g/dL (ref 3.5–5.2)
Alkaline Phosphatase: 71 U/L (ref 39–117)
BUN: 12 mg/dL (ref 6–23)
CALCIUM: 9.6 mg/dL (ref 8.4–10.5)
CO2: 30 mEq/L (ref 19–32)
CREATININE: 0.56 mg/dL (ref 0.50–1.10)
Chloride: 103 mEq/L (ref 96–112)
Glucose, Bld: 96 mg/dL (ref 70–99)
POTASSIUM: 4.7 meq/L (ref 3.5–5.3)
Sodium: 139 mEq/L (ref 135–145)
Total Bilirubin: 0.4 mg/dL (ref 0.2–1.2)
Total Protein: 6.8 g/dL (ref 6.0–8.3)

## 2014-08-09 LAB — CBC
HCT: 41.2 % (ref 36.0–46.0)
Hemoglobin: 13.8 g/dL (ref 12.0–15.0)
MCH: 30.1 pg (ref 26.0–34.0)
MCHC: 33.5 g/dL (ref 30.0–36.0)
MCV: 90 fL (ref 78.0–100.0)
PLATELETS: 271 10*3/uL (ref 150–400)
RBC: 4.58 MIL/uL (ref 3.87–5.11)
RDW: 13.2 % (ref 11.5–15.5)
WBC: 11 10*3/uL — AB (ref 4.0–10.5)

## 2014-08-09 LAB — LIPID PANEL
Cholesterol: 187 mg/dL (ref 0–200)
HDL: 65 mg/dL (ref >39)
LDL CALC: 106 mg/dL — AB (ref 0–99)
Total CHOL/HDL Ratio: 2.9 Ratio
Triglycerides: 79 mg/dL (ref <150)
VLDL: 16 mg/dL (ref 0–40)

## 2014-08-09 LAB — HEMOGLOBIN, FINGERSTICK: Hemoglobin, fingerstick: 14.2 g/dL (ref 12.0–16.0)

## 2014-08-09 NOTE — Progress Notes (Signed)
Patient ID: Kathryn Sherman, female   DOB: 09-15-1971, 43 y.o.   MRN: [id number] GYNECOLOGY VISIT  PCP:   None  Referring provider:   HPI: 43 y.o.   Single  [place]  female   G0P0 with Patient's last menstrual period was 07/27/2014.   here for  AEX. Menses normal again.   Declines STD testing.  Partner for 5 years.   Mother passed away in 01/16/14.   Hgb:    14.2 Urine:  Neg  GYNECOLOGIC HISTORY: Patient's last menstrual period was 07/27/2014. Sexually active:  yes Partner preference: female Contraception: None   Menopausal hormone therapy: n/a DES exposure: no   Blood transfusions: no   Sexually transmitted diseases:  no  GYN procedures and prior surgeries:  D & C Last mammogram:  08-23-13 dense breasts.  Left breast reveal calcifications--otherwise normal.  Patient had diagnostic left mammogram 09-30-13 which revealed benign calcifications.  Recommend screening in 1 year:[facility]                Last pap and high risk HPV testing:  08-08-13 wnl:neg HR HPV  History of abnormal pap smear: no    OB History   Grav Para Term Preterm Abortions TAB SAB Ect Mult Living   0                LIFESTYLE: Exercise:   no             OTHER HEALTH MAINTENANCE: Tetanus/TDap:  Unsure.  States she is up to date.  HPV:                  n/a Influenza:           never   Bone density:   n/a Colonoscopy:  n/a  Cholesterol check: 11/2011 slightly elevated  Family History  Problem Relation Age of Onset  . Diabetes Mother   . Hyperlipidemia Mother   . Hypertension Mother   . Rheum arthritis Mother   . Hyperlipidemia Father     Patient Active Problem List   Diagnosis Date Noted  . Endometrial polyp 10/10/2013  . Intermenstrual bleeding 08/18/2013   Past Medical History  Diagnosis Date  . Abnormal uterine bleeding   . Infertility, female   . Elevated cholesterol 11/2011    Past Surgical History  Procedure Laterality Date  . Dilatation & currettage/hysteroscopy  with resectocope N/A 11/22/2013    Procedure: DILATATION & CURETTAGE/HYSTEROSCOPY WITH RESECTOCOPE;  Surgeon: Jamey Reas de Berton Lan, MD;  Location: [facility];  Service: Gynecology;  Laterality: N/A;    ALLERGIES: Review of patient's allergies indicates no known allergies.  No current outpatient prescriptions on file.   No current facility-administered medications for this visit.     ROS:  Pertinent items are noted in HPI.  History   Social History  . Marital Status: Single    Spouse Name: N/A    Number of Children: N/A  . Years of Education: N/A   Occupational History  . Not on file.   Social History Main Topics  . Smoking status: Never Smoker   . Smokeless tobacco: Never Used  . Alcohol Use: No  . Drug Use: No  . Sexual Activity: Yes    Partners: Male    Birth Control/ Protection: None     Comment: has never used contraception   Other Topics Concern  . Not on file   Social History Narrative  . No narrative on file    PHYSICAL  EXAMINATION:    BP 100/66  Pulse 74  Resp 14  Ht 4\' 11"  (1.499 m)  Wt 114 lb 3.2 oz (51.801 kg)  BMI 23.05 kg/m2  LMP 07/27/2014   Wt Readings from Last 3 Encounters:  08/09/14 114 lb 3.2 oz (51.801 kg)  11/10/13 110 lb (49.896 kg)  11/10/13 110 lb (49.896 kg)     Ht Readings from Last 3 Encounters:  08/09/14 4\' 11"  (1.499 m)  11/10/13 5' (1.524 m)  11/10/13 5' (1.524 m)    General appearance: alert, cooperative and appears stated age Head: Normocephalic, without obvious abnormality, atraumatic Neck: no adenopathy, supple, symmetrical, trachea midline and thyroid not enlarged, symmetric, no tenderness/mass/nodules Lungs: clear to auscultation bilaterally Breasts: Inspection negative, No nipple retraction or dimpling, No nipple discharge or bleeding, No axillary or supraclavicular adenopathy, Normal to palpation without dominant masses Heart: regular rate and rhythm Abdomen: soft, non-tender; no masses,  no  organomegaly Extremities: extremities normal, atraumatic, no cyanosis or edema Skin: Skin color, texture, turgor normal. No rashes or lesions Lymph nodes: Cervical, supraclavicular, and axillary nodes normal. No abnormal inguinal nodes palpated Neurologic: Grossly normal  Pelvic: External genitalia:  no lesions              Urethra:  normal appearing urethra with no masses, tenderness or lesions              Bartholins and Skenes: normal                 Vagina: normal appearing vagina with normal color and discharge, no lesions              Cervix: normal appearance              Pap and high risk HPV testing done: No.        Bimanual Exam:  Uterus:  uterus is normal size, shape, consistency and nontender                                      Adnexa: normal adnexa in size, nontender and no masses                                      Rectovaginal:  Yes.                                        Confirms above.                                      Anus:  normal sphincter tone, no lesions  ASSESSMENT  Normal gynecologic exam.  PLAN  Mammogram recommended yearly starting at age 44.  Due for mammogram in November 2015. Will assist patient to schedule.  Pap smear and high risk HPV testing as above. Counseled on self breast exam, Calcium and vitamin D intake, exercise. Will take PNV and one Tums daily.  See lab orders: Yes.    Return annually or prn   An After Visit Summary was printed and given to the patient.

## 2014-08-09 NOTE — Progress Notes (Signed)
Patient is scheduled for screening mammogram at [facility] for 08/23/14 at 0730. Per Cherish, patient okay to for screening as it has been one year since screening. Patient agreeable. Also gave patient written information from breast center regarding 3D Mammogram with appointment reminder. Patient will review and decide if would like to do 3D mammograms for future imaging.

## 2014-08-09 NOTE — Patient Instructions (Signed)

## 2014-08-23 ENCOUNTER — Ambulatory Visit
Admission: RE | Admit: 2014-08-23 | Discharge: 2014-08-23 | Disposition: A | Discharge status: Home / Self Care | Payer: BC Managed Care – PPO | Source: Ambulatory Visit | Attending: Obstetrics and Gynecology | Admitting: Obstetrics and Gynecology

## 2014-08-23 DIAGNOSIS — Z1231 Encounter for screening mammogram for malignant neoplasm of breast: Secondary | ICD-10-CM

## 2015-08-01 ENCOUNTER — Other Ambulatory Visit: Payer: Self-pay

## 2015-08-01 DIAGNOSIS — Z1231 Encounter for screening mammogram for malignant neoplasm of breast: Secondary | ICD-10-CM

## 2015-08-22 ENCOUNTER — Ambulatory Visit
Admission: RE | Admit: 2015-08-22 | Discharge: 2015-08-22 | Disposition: A | Discharge status: Home / Self Care | Payer: 59 | Source: Ambulatory Visit

## 2015-08-22 ENCOUNTER — Encounter: Payer: Self-pay | Admitting: Obstetrics and Gynecology

## 2015-08-22 ENCOUNTER — Ambulatory Visit (INDEPENDENT_AMBULATORY_CARE_PROVIDER_SITE_OTHER): Payer: 59 | Admitting: Obstetrics and Gynecology

## 2015-08-22 VITALS — BP 100/66 | HR 80 | Resp 16 | Ht 59.25 in | Wt 114.2 lb

## 2015-08-22 DIAGNOSIS — Z0000 Encounter for general adult medical examination without abnormal findings: Secondary | ICD-10-CM | POA: Diagnosis not present

## 2015-08-22 DIAGNOSIS — Z1231 Encounter for screening mammogram for malignant neoplasm of breast: Secondary | ICD-10-CM

## 2015-08-22 DIAGNOSIS — Z01419 Encounter for gynecological examination (general) (routine) without abnormal findings: Secondary | ICD-10-CM

## 2015-08-22 LAB — CBC
HCT: 41 % (ref 36.0–46.0)
HEMOGLOBIN: 13.9 g/dL (ref 12.0–15.0)
MCH: 30.8 pg (ref 26.0–34.0)
MCHC: 33.9 g/dL (ref 30.0–36.0)
MCV: 90.9 fL (ref 78.0–100.0)
MPV: 10.7 fL (ref 8.6–12.4)
Platelets: 258 10*3/uL (ref 150–400)
RBC: 4.51 MIL/uL (ref 3.87–5.11)
RDW: 13 % (ref 11.5–15.5)
WBC: 7.4 10*3/uL (ref 4.0–10.5)

## 2015-08-22 LAB — COMPREHENSIVE METABOLIC PANEL
ALT: 16 U/L (ref 6–29)
AST: 17 U/L (ref 10–30)
Albumin: 4 g/dL (ref 3.6–5.1)
Alkaline Phosphatase: 55 U/L (ref 33–115)
BILIRUBIN TOTAL: 0.6 mg/dL (ref 0.2–1.2)
BUN: 11 mg/dL (ref 7–25)
CALCIUM: 9 mg/dL (ref 8.6–10.2)
CO2: 23 mmol/L (ref 20–31)
Chloride: 108 mmol/L (ref 98–110)
Creat: 0.62 mg/dL (ref 0.50–1.10)
GLUCOSE: 100 mg/dL — AB (ref 65–99)
Potassium: 3.8 mmol/L (ref 3.5–5.3)
Sodium: 138 mmol/L (ref 135–146)
Total Protein: 6.5 g/dL (ref 6.1–8.1)

## 2015-08-22 LAB — LIPID PANEL
CHOL/HDL RATIO: 3 ratio (ref <=5.0)
CHOLESTEROL: 187 mg/dL (ref 125–200)
HDL: 63 mg/dL (ref >=46)
LDL Cholesterol: 112 mg/dL (ref <130)
Triglycerides: 58 mg/dL (ref <150)
VLDL: 12 mg/dL (ref <30)

## 2015-08-22 LAB — POCT URINALYSIS DIPSTICK
Bilirubin, UA: NEGATIVE
Glucose, UA: NEGATIVE
KETONES UA: NEGATIVE
Leukocytes, UA: NEGATIVE
Nitrite, UA: NEGATIVE
PH UA: 5
Protein, UA: NEGATIVE
RBC UA: NEGATIVE
UROBILINOGEN UA: NEGATIVE

## 2015-08-22 NOTE — Progress Notes (Signed)
Patient ID: Kathryn Sherman, female   DOB: [birth year], 44 y.o.   MRN: [id number] 43 y.o. G0P0 Single [place] female here for annual exam.   No concerns.   Monthly menses, regular every 27 days, not heavy.   Mammogram today.   Calcium once per day plus one yogurt daily.   PCP:  None  Patient's last menstrual period was 08/02/2015 (exact date).          Sexually active: Yes.   female partner The current method of family planning is none.    Exercising: No.   Smoker:  no  Health Maintenance: Pap:  08-08-13 Neg:Neg HR HPV History of abnormal Pap:  no MMG:  08-21-14 Density Cat.D/Neg/BiRads 1:[facility]. Colonoscopy:  n/a BMD:   n/a  Result  n/a TDaP:  Greater than 10 years?  Patient will check and then call us back. Screening Labs:   , Urine today: Neg   reports that she has never smoked. She has never used smokeless tobacco. She reports that she does not drink alcohol or use illicit drugs.  Past Medical History  Diagnosis Date  . Abnormal uterine bleeding   . Infertility, female   . Elevated cholesterol 11/2011    Past Surgical History  Procedure Laterality Date  . Dilatation & currettage/hysteroscopy with resectocope N/A 11/22/2013    Procedure: DILATATION & CURETTAGE/HYSTEROSCOPY WITH RESECTOCOPE;  Surgeon: Jamey Reas de Berton Lan, MD;  Location: [facility];  Service: Gynecology;  Laterality: N/A;    No current outpatient prescriptions on file.   No current facility-administered medications for this visit.    Family History  Problem Relation Age of Onset  . Diabetes Mother   . Hyperlipidemia Mother   . Hypertension Mother   . Rheum arthritis Mother   . Hyperlipidemia Father     ROS:  Pertinent items are noted in HPI.  Otherwise, a comprehensive ROS was negative.  Exam:   BP 100/66 mmHg  Pulse 80  Resp 16  Ht 4' 11.25" (1.505 m)  Wt 114 lb 3.2 oz (51.801 kg)  BMI 22.87 kg/m2  LMP 08/02/2015 (Exact Date)    General appearance: alert,  cooperative and appears stated age Head: Normocephalic, without obvious abnormality, atraumatic Neck: no adenopathy, supple, symmetrical, trachea midline and thyroid normal to inspection and palpation Lungs: clear to auscultation bilaterally Breasts: normal appearance, no masses or tenderness, Inspection negative, No nipple retraction or dimpling, No nipple discharge or bleeding, No axillary or supraclavicular adenopathy Heart: regular rate and rhythm Abdomen: soft, non-tender; bowel sounds normal; no masses,  no organomegaly Extremities: extremities normal, atraumatic, no cyanosis or edema Skin: Skin color, texture, turgor normal. No rashes or lesions Lymph nodes: Cervical, supraclavicular, and axillary nodes normal. No abnormal inguinal nodes palpated Neurologic: Grossly normal  Pelvic: External genitalia:  no lesions              Urethra:  normal appearing urethra with no masses, tenderness or lesions              Bartholins and Skenes: normal                 Vagina: normal appearing vagina with normal color and discharge, no lesions              Cervix: no lesions              Pap taken: No. Bimanual Exam:  Uterus:  normal size, contour, position, consistency, mobility, non-tender  Adnexa: normal adnexa and no mass, fullness, tenderness              Rectovaginal: Yes.  .  Confirms.              Anus:  normal sphincter tone, no lesions  Chaperone was present for exam.  Assessment:   Well woman visit with normal exam. Hx infertility.  Declines contraception.  Plan: Yearly mammogram recommended after age 74.  Mammogram today.  Recommended self breast exam.  Pap and HR HPV as above. Discussed Calcium, Vitamin D, regular exercise program including cardiovascular and weight bearing exercise. Labs performed.  Yes.  .   See orders. Refills given on medications.  No..   Follow up annually and prn.     After visit summary provided.

## 2015-08-22 NOTE — Patient Instructions (Signed)

## 2015-08-24 ENCOUNTER — Other Ambulatory Visit: Payer: Self-pay | Admitting: Obstetrics and Gynecology

## 2015-08-24 DIAGNOSIS — R928 Other abnormal and inconclusive findings on diagnostic imaging of breast: Secondary | ICD-10-CM

## 2015-09-05 ENCOUNTER — Ambulatory Visit
Admission: RE | Admit: 2015-09-05 | Discharge: 2015-09-05 | Disposition: A | Discharge status: Home / Self Care | Payer: 59 | Source: Ambulatory Visit | Attending: Obstetrics and Gynecology | Admitting: Obstetrics and Gynecology

## 2015-09-05 DIAGNOSIS — R928 Other abnormal and inconclusive findings on diagnostic imaging of breast: Secondary | ICD-10-CM

## 2016-01-29 ENCOUNTER — Other Ambulatory Visit: Payer: Self-pay | Admitting: Obstetrics and Gynecology

## 2016-01-29 DIAGNOSIS — R921 Mammographic calcification found on diagnostic imaging of breast: Secondary | ICD-10-CM

## 2016-03-12 ENCOUNTER — Ambulatory Visit
Admission: RE | Admit: 2016-03-12 | Discharge: 2016-03-12 | Disposition: A | Discharge status: Home / Self Care | Payer: BLUE CROSS/BLUE SHIELD | Source: Ambulatory Visit | Attending: Obstetrics and Gynecology | Admitting: Obstetrics and Gynecology

## 2016-03-12 DIAGNOSIS — R921 Mammographic calcification found on diagnostic imaging of breast: Secondary | ICD-10-CM

## 2016-08-18 ENCOUNTER — Other Ambulatory Visit: Payer: Self-pay | Admitting: Obstetrics and Gynecology

## 2016-08-18 DIAGNOSIS — Z1231 Encounter for screening mammogram for malignant neoplasm of breast: Secondary | ICD-10-CM

## 2016-08-27 ENCOUNTER — Ambulatory Visit
Admission: RE | Admit: 2016-08-27 | Discharge: 2016-08-27 | Disposition: A | Discharge status: Home / Self Care | Payer: BLUE CROSS/BLUE SHIELD | Source: Ambulatory Visit | Attending: Obstetrics and Gynecology | Admitting: Obstetrics and Gynecology

## 2016-08-27 DIAGNOSIS — Z1231 Encounter for screening mammogram for malignant neoplasm of breast: Secondary | ICD-10-CM

## 2016-09-15 NOTE — Progress Notes (Signed)
45 y.o. G0P0 Single [place] female here for annual exam.    Bleeding a little today. Menses are one or twice a month, every 15 - 20 days.  It is not heavy.  No pain.   No change in partner.  Declines STD check.   Father is 9 and he drives a motor cycle.   PCP:   None.  Patient's last menstrual period was 08/24/2016.           Sexually active: Yes.    The current method of family planning is none.    Exercising: No.  The patient does not participate in regular exercise at present. Smoker:  no  Health Maintenance: Pap:  08-08-13 Neg:Neg HR HPV History of abnormal Pap:  no MMG:  08-29-16 density C/Neg/BiRads1:[facility] Colonoscopy:  n/a BMD:   n/a  Result  n/a TDaP:  Per patient, has not gotten Tdap.  She states she has done this but does not remember the date.  Gardasil:   N/A HIV: declines. Hep C:  NA Screening Labs:  Hb today: Not drawn, Urine today: unable to void   reports that she has never smoked. She has never used smokeless tobacco. She reports that she does not drink alcohol or use drugs.  Past Medical History:  Diagnosis Date  . Abnormal uterine bleeding   . Elevated cholesterol 11/2011  . Infertility, female     Past Surgical History:  Procedure Laterality Date  . DILATATION & CURRETTAGE/HYSTEROSCOPY WITH RESECTOCOPE N/A 11/22/2013   Procedure: DILATATION & CURETTAGE/HYSTEROSCOPY WITH RESECTOCOPE;  Surgeon: Jamey Reas de Berton Lan, MD;  Location: [facility];  Service: Gynecology;  Laterality: N/A;    No current outpatient prescriptions on file.   No current facility-administered medications for this visit.     Family History  Problem Relation Age of Onset  . Diabetes Mother   . Hyperlipidemia Mother   . Hypertension Mother   . Rheum arthritis Mother   . Hyperlipidemia Father     ROS:  Pertinent items are noted in HPI.  Otherwise, a comprehensive ROS was negative.  Exam:   BP 112/72 (BP Location: Right Arm, Patient Position:  Sitting, Cuff Size: Normal)   Pulse 76   Resp 16   Ht 4' 11.5" (1.511 m)   Wt 119 lb (54 kg)   LMP 08/24/2016   BMI 23.63 kg/m     General appearance: alert, cooperative and appears stated age Head: Normocephalic, without obvious abnormality, atraumatic Neck: no adenopathy, supple, symmetrical, trachea midline and thyroid normal to inspection and palpation Lungs: clear to auscultation bilaterally Breasts: normal appearance, no masses or tenderness, No nipple retraction or dimpling, No nipple discharge or bleeding, No axillary or supraclavicular adenopathy Heart: regular rate and rhythm Abdomen: soft, non-tender; no masses, no organomegaly Extremities: extremities normal, atraumatic, no cyanosis or edema Skin: Skin color, texture, turgor normal. No rashes or lesions Lymph nodes: Cervical, supraclavicular, and axillary nodes normal. No abnormal inguinal nodes palpated Neurologic: Grossly normal  Pelvic: External genitalia:  no lesions              Urethra:  normal appearing urethra with no masses, tenderness or lesions              Bartholins and Skenes: normal                 Vagina: normal appearing vagina with normal color and discharge, no lesions  Cervix: no lesions              Pap taken: Yes.   Bimanual Exam:  Uterus:  normal size, contour, position, consistency, mobility, non-tender              Adnexa: no mass, fullness, tenderness              Rectal exam: Yes.  .  Confirms.              Anus:  normal sphincter tone, no lesions  Chaperone was present for exam.  Assessment:   Well woman visit with normal exam. Hx infertility.  Declines contraception.  Metrorrhagia.  Hx endometrial polyp resection 2014.  Plan: Yearly mammogram recommended after age 33.  Recommended self breast exam.  Pap and HR HPV as above. Guidelines for Calcium, Vitamin D, regular exercise program including cardiovascular and weight bearing exercise. Routine labs today. Return for  sonohysterogram and possible EMB. Follow up annually and prn.      After visit summary provided.

## 2016-09-17 ENCOUNTER — Ambulatory Visit (INDEPENDENT_AMBULATORY_CARE_PROVIDER_SITE_OTHER): Payer: BLUE CROSS/BLUE SHIELD | Admitting: Obstetrics and Gynecology

## 2016-09-17 ENCOUNTER — Encounter: Payer: Self-pay | Admitting: Obstetrics and Gynecology

## 2016-09-17 VITALS — BP 112/72 | HR 76 | Resp 16 | Ht 59.5 in | Wt 119.0 lb

## 2016-09-17 DIAGNOSIS — Z01419 Encounter for gynecological examination (general) (routine) without abnormal findings: Secondary | ICD-10-CM | POA: Diagnosis not present

## 2016-09-17 DIAGNOSIS — N921 Excessive and frequent menstruation with irregular cycle: Secondary | ICD-10-CM

## 2016-09-17 LAB — COMPREHENSIVE METABOLIC PANEL
ALT: 14 U/L (ref 6–29)
AST: 13 U/L (ref 10–35)
Albumin: 4.4 g/dL (ref 3.6–5.1)
Alkaline Phosphatase: 52 U/L (ref 33–115)
BUN: 10 mg/dL (ref 7–25)
CHLORIDE: 107 mmol/L (ref 98–110)
CO2: 21 mmol/L (ref 20–31)
Calcium: 9.4 mg/dL (ref 8.6–10.2)
Creat: 0.74 mg/dL (ref 0.50–1.10)
GLUCOSE: 100 mg/dL — AB (ref 65–99)
POTASSIUM: 3.9 mmol/L (ref 3.5–5.3)
Sodium: 139 mmol/L (ref 135–146)
Total Bilirubin: 0.5 mg/dL (ref 0.2–1.2)
Total Protein: 7.2 g/dL (ref 6.1–8.1)

## 2016-09-17 LAB — LIPID PANEL
Cholesterol: 205 mg/dL — ABNORMAL HIGH (ref 125–200)
HDL: 60 mg/dL (ref >=46)
LDL CALC: 130 mg/dL — AB (ref <130)
TRIGLYCERIDES: 74 mg/dL (ref <150)
Total CHOL/HDL Ratio: 3.4 Ratio (ref <=5.0)
VLDL: 15 mg/dL (ref <30)

## 2016-09-17 LAB — CBC
HCT: 44.9 % (ref 35.0–45.0)
Hemoglobin: 14.9 g/dL (ref 11.7–15.5)
MCH: 30.5 pg (ref 27.0–33.0)
MCHC: 33.2 g/dL (ref 32.0–36.0)
MCV: 91.8 fL (ref 80.0–100.0)
MPV: 10.9 fL (ref 7.5–12.5)
PLATELETS: 271 10*3/uL (ref 140–400)
RBC: 4.89 MIL/uL (ref 3.80–5.10)
RDW: 13.1 % (ref 11.0–15.0)
WBC: 7.5 10*3/uL (ref 3.8–10.8)

## 2016-09-17 LAB — TSH: TSH: 2.4 mIU/L

## 2016-09-17 NOTE — Patient Instructions (Signed)

## 2016-09-22 LAB — IPS PAP TEST WITH HPV

## 2016-09-25 ENCOUNTER — Encounter: Payer: Self-pay | Admitting: Obstetrics and Gynecology

## 2016-09-25 ENCOUNTER — Ambulatory Visit (INDEPENDENT_AMBULATORY_CARE_PROVIDER_SITE_OTHER): Payer: BLUE CROSS/BLUE SHIELD

## 2016-09-25 ENCOUNTER — Other Ambulatory Visit: Payer: Self-pay | Admitting: Obstetrics and Gynecology

## 2016-09-25 ENCOUNTER — Ambulatory Visit (INDEPENDENT_AMBULATORY_CARE_PROVIDER_SITE_OTHER): Payer: BLUE CROSS/BLUE SHIELD | Admitting: Obstetrics and Gynecology

## 2016-09-25 VITALS — BP 96/60 | HR 64 | Ht 59.5 in | Wt 119.0 lb

## 2016-09-25 DIAGNOSIS — Z01812 Encounter for preprocedural laboratory examination: Secondary | ICD-10-CM | POA: Diagnosis not present

## 2016-09-25 DIAGNOSIS — D219 Benign neoplasm of connective and other soft tissue, unspecified: Secondary | ICD-10-CM

## 2016-09-25 DIAGNOSIS — N921 Excessive and frequent menstruation with irregular cycle: Secondary | ICD-10-CM | POA: Diagnosis not present

## 2016-09-25 DIAGNOSIS — D251 Intramural leiomyoma of uterus: Secondary | ICD-10-CM

## 2016-09-25 HISTORY — DX: Benign neoplasm of connective and other soft tissue, unspecified: D21.9

## 2016-09-25 LAB — POCT URINE PREGNANCY: Preg Test, Ur: NEGATIVE

## 2016-09-25 NOTE — Progress Notes (Signed)
Patient ID: Kathryn Sherman, female   DOB: [birth year], 45 y.o.   MRN: [id number] GYNECOLOGY  VISIT   HPI: 45 y.o.   Single  [place]  female   G0P0 with Patient's last menstrual period was 09/17/2016 (approximate).   here for pelvic ultrasound for metrorrhagia.   Menses are 10 - 11 times a year. No menstrual pain.  Hx infertility. Never used birth control. States long history of irregular menses.  Neg hx HTN, liver disease, DVT or PE, or migraine HAs.  No FH of thromboembolic events. Not a smoker.   UPT negative, performed in the ultrasound suite.   GYNECOLOGIC HISTORY: Patient's last menstrual period was 09/17/2016 (approximate). Contraception:  none Menopausal hormone therapy:  n/a Last mammogram:  08-29-16 density C/Neg/BiRads1:[facility]  Last pap smear:   09-17-16 Neg:Neg HR HPV        OB History    Gravida Para Term Preterm AB Living   0             SAB TAB Ectopic Multiple Live Births                     There are no active problems to display for this patient.   Past Medical History:  Diagnosis Date  . Abnormal uterine bleeding   . Elevated cholesterol 11/2011  . Infertility, female     Past Surgical History:  Procedure Laterality Date  . DILATATION & CURRETTAGE/HYSTEROSCOPY WITH RESECTOCOPE N/A 11/22/2013   Procedure: DILATATION & CURETTAGE/HYSTEROSCOPY WITH RESECTOCOPE;  Surgeon: Jamey Reas de Berton Lan, MD;  Location: [facility];  Service: Gynecology;  Laterality: N/A;    No current outpatient prescriptions on file.   No current facility-administered medications for this visit.      ALLERGIES: Review of patient's allergies indicates no known allergies.  Family History  Problem Relation Age of Onset  . Diabetes Mother   . Hyperlipidemia Mother   . Hypertension Mother   . Rheum arthritis Mother   . Hyperlipidemia Father     Social History   Social History  . Marital status: Single    Spouse name: N/A  . Number of children:  N/A  . Years of education: N/A   Occupational History  . Not on file.   Social History Main Topics  . Smoking status: Never Smoker  . Smokeless tobacco: Never Used  . Alcohol use No  . Drug use: No  . Sexual activity: Yes    Partners: Male    Birth control/ protection: None     Comment: has never used contraception   Other Topics Concern  . Not on file   Social History Narrative  . No narrative on file    ROS:  Pertinent items are noted in HPI.  PHYSICAL EXAMINATION:    BP 96/60 (BP Location: Right Arm, Patient Position: Sitting, Cuff Size: Normal)   Pulse 64   Ht 4' 11.5" (1.511 m)   Wt 119 lb (54 kg)   LMP 09/17/2016 (Approximate)   BMI 23.63 kg/m     General appearance: alert, cooperative and appears stated age   Cath urine specimen done for UPT as patient unable to void. Verbal consent for procedure.  Sterile prep of urethra with betadine. 5 cc clear yellow urine removed.  UPT done in room - negative.   Technique:  Both transabdominal and transvaginal ultrasound examinations of the pelvis were performed. Transabdominal technique was performed for global imaging of the pelvis including uterus, ovaries,  adnexal regions, and pelvic cul-de-sac. It was necessary to proceed with endovaginal exam following the abdominal ultrasound.  Transabdominal exam to visualize the endometrium and adnexa.  Color and duplex Doppler ultrasound was utilized to evaluate blood flow to the ovaries.  Uterus with EMS 6.7 mm. Vascular flow noted.  8 mm fibroid.  Ovaries normal.  No masses.  No free fluid.   Procedure - sonohysterogram Consent performed. Speculum placed in vagina. Sterile prep of cervix with  Hibiclens. Cannula placed inside endometrial cavity without difficulty. Speculum removed. Sterile saline injected.     No         filling defect noted. Cannula removed. No complication.   Procedure - endometrial biopsy Consent performed. Speculum place in vagina.  Sterile  prep of cervix with  Hibiclens. Tenaculum to anterior cervical lip. Pipelle placed to      7   cm without difficulty twice. Tissue obtained and sent to pathology. Speculum removed.  No complications. Minimal EBL.  Chaperone was present for exam.  ASSESSMENT  Metrorrhagia.  Hx irregular menses.  Small fibroid. Hx infertility.  Status post hysteroscopic removal of endometrial polyps.  PLAN  Discussion of anovulatory bleeding.  Will follow up EMB.  We talked about methods for cycle regulation using OCPs, Nuvaring, OrthoEvra, and Mirena IUD.  She chooses OCPs provided the EMB supports this plan.  I would choose Loestrin 1/20.  We discussed proper use, side effects and risks and warning signs of DVT, PE, MI, and stroke.  She would need a 3 month recheck appt.   An After Visit Summary was printed and given to the patient.  ____25__ minutes face to face time of which over 50% was spent in counseling.

## 2016-09-25 NOTE — Patient Instructions (Addendum)
Endometrial Biopsy, Care After Refer to this sheet in the next few weeks. These instructions provide you with information on caring for yourself after your procedure. Your health care provider may also give you more specific instructions. Your treatment has been planned according to current medical practices, but problems sometimes occur. Call your health care provider if you have any problems or questions after your procedure. WHAT TO EXPECT AFTER THE PROCEDURE After your procedure, it is typical to have the following:  You may have mild cramping and a small amount of vaginal bleeding for a few days after the procedure. This is normal. HOME CARE INSTRUCTIONS  Only take over-the-counter or prescription medicine as directed by your health care provider.  Do not douche, use tampons, or have sexual intercourse until your health care provider approves.  Follow your health care provider's instructions regarding any activity restrictions, such as strenuous exercise or heavy lifting. SEEK MEDICAL CARE IF:  You have heavy bleeding or bleeding longer than 2 days after the procedure.  You have bad smelling drainage from your vagina.  You have a fever and chills.  Youhave severe lower stomach (abdominal) pain. SEEK IMMEDIATE MEDICAL CARE IF:  You have severe cramps in your stomach or back.  You pass large blood clots.  Your bleeding increases.  You become weak or lightheaded, or you pass out.   This information is not intended to replace advice given to you by your health care provider. Make sure you discuss any questions you have with your health care provider.   Document Released: 08/31/2013 Document Reviewed: 08/31/2013 Elsevier Interactive Patient Education 2016 Elsevier Inc.  Ethinyl Estradiol; Norethindrone Acetate tablets (contraception) What is this medicine? ETHINYL ESTRADIOL; NORETHINDRONE ACETATE (ETH in il es tra DYE ole; nor eth IN drone AS e tate) is an oral  contraceptive. The products combine two types of female hormones, an estrogen and a progestin. They are used to prevent ovulation and pregnancy. This medicine may be used for other purposes; ask your health care provider or pharmacist if you have questions. What should I tell my health care provider before I take this medicine? They need to know if you have or ever had any of these conditions: -abnormal vaginal bleeding -blood vessel disease or blood clots -breast, cervical, endometrial, ovarian, liver, or uterine cancer -diabetes -gallbladder disease -heart disease or recent heart attack -high blood pressure -high cholesterol -kidney disease -liver disease -migraine headaches -stroke -systemic lupus erythematosus (SLE) -tobacco smoker -an unusual or allergic reaction to estrogens, progestins, other medicines, foods, dyes, or preservatives -pregnant or trying to get pregnant -breast-feeding How should I use this medicine? Take this medicine by mouth. To reduce nausea, this medicine may be taken with food. Follow the directions on the prescription label. Take this medicine at the same time each day and in the order directed on the package. Do not take your medicine more often than directed. Contact your pediatrician regarding the use of this medicine in children. Special care may be needed. This medicine has been used in female children who have started having menstrual periods. A patient package insert for the product will be given with each prescription and refill. Read this sheet carefully each time. The sheet may change frequently. Overdosage: If you think you have taken too much of this medicine contact a poison control center or emergency room at once. NOTE: This medicine is only for you. Do not share this medicine with others. What if I miss a dose? If you miss a  dose, refer to the patient information sheet you received with your medicine for direction. If you miss more than one  pill, this medicine may not be as effective and you may need to use another form of birth control. What may interact with this medicine? -acetaminophen -antibiotics or medicines for infections, especially rifampin, rifabutin, rifapentine, and griseofulvin, and possibly penicillins or tetracyclines -aprepitant -ascorbic acid (vitamin C) -atorvastatin -barbiturate medicines, such as phenobarbital -bosentan -carbamazepine -caffeine -clofibrate -cyclosporine -dantrolene -doxercalciferol -felbamate -grapefruit juice -hydrocortisone -medicines for anxiety or sleeping problems, such as diazepam or temazepam -medicines for diabetes, including pioglitazone -mineral oil -modafinil -mycophenolate -nefazodone -oxcarbazepine -phenytoin -prednisolone -ritonavir or other medicines for HIV infection or AIDS -rosuvastatin -selegiline -soy isoflavones supplements -St. John's wort -tamoxifen or raloxifene -theophylline -thyroid hormones -topiramate -warfarin This list may not describe all possible interactions. Give your health care provider a list of all the medicines, herbs, non-prescription drugs, or dietary supplements you use. Also tell them if you smoke, drink alcohol, or use illegal drugs. Some items may interact with your medicine. What should I watch for while using this medicine? Visit your doctor or health care professional for regular checks on your progress. You will need a regular breast and pelvic exam and Pap smear while on this medicine. Use an additional method of contraception during the first cycle that you take these tablets. If you have any reason to think you are pregnant, stop taking this medicine right away and contact your doctor or health care professional. If you are taking this medicine for hormone related problems, it may take several cycles of use to see improvement in your condition. Smoking increases the risk of getting a blood clot or having a stroke while  you are taking birth control pills, especially if you are more than 46 years old. You are strongly advised not to smoke. This medicine can make your body retain fluid, making your fingers, hands, or ankles swell. Your blood pressure can go up. Contact your doctor or health care professional if you feel you are retaining fluid. This medicine can make you more sensitive to the sun. Keep out of the sun. If you cannot avoid being in the sun, wear protective clothing and use sunscreen. Do not use sun lamps or tanning beds/booths. If you wear contact lenses and notice visual changes, or if the lenses begin to feel uncomfortable, consult your eye care specialist. In some women, tenderness, swelling, or minor bleeding of the gums may occur. Notify your dentist if this happens. Brushing and flossing your teeth regularly may help limit this. See your dentist regularly and inform your dentist of the medicines you are taking. If you are going to have elective surgery, you may need to stop taking this medicine before the surgery. Consult your health care professional for advice. This medicine does not protect you against HIV infection (AIDS) or any other sexually transmitted diseases. What side effects may I notice from receiving this medicine? Side effects that you should report to your doctor or health care professional as soon as possible: -breast tissue changes or discharge -changes in vaginal bleeding during your period or between your periods -chest pain -coughing up blood -dizziness or fainting spells -headaches or migraines -leg, arm or groin pain -severe or sudden headaches -stomach pain (severe) -sudden shortness of breath -sudden loss of coordination, especially on one side of the body -speech problems -symptoms of vaginal infection like itching, irritation or unusual discharge -tenderness in the upper abdomen -vomiting -weakness or numbness in  the arms or legs, especially on one side of the  body -yellowing of the eyes or skin Side effects that usually do not require medical attention (report to your doctor or health care professional if they continue or are bothersome): -breakthrough bleeding and spotting that continues beyond the 3 initial cycles of pills -breast tenderness -mood changes, anxiety, depression, frustration, anger, or emotional outbursts -increased sensitivity to sun or ultraviolet light -nausea -skin rash, acne, or brown spots on the skin -weight gain (slight) This list may not describe all possible side effects. Call your doctor for medical advice about side effects. You may report side effects to FDA at 1-800-FDA-1088. Where should I keep my medicine? Keep out of the reach of children. Store at room temperature between 15 and 30 degrees C (59 and 86 degrees F). Throw away any unused medicine after the expiration date. NOTE: This sheet is a summary. It may not cover all possible information. If you have questions about this medicine, talk to your doctor, pharmacist, or health care provider.    2016, Elsevier/Gold Standard. (2013-03-18 15:35:20)

## 2016-09-25 NOTE — Progress Notes (Signed)
Encounter reviewed by Dr. Louana Fontenot Amundson C. Silva.  

## 2016-09-29 LAB — IPS OTHER TISSUE BIOPSY

## 2016-10-02 ENCOUNTER — Other Ambulatory Visit: Payer: Self-pay | Admitting: *Deleted

## 2016-10-02 MED ORDER — NORETHINDRONE ACET-ETHINYL EST 1-20 MG-MCG PO TABS: 1.0000 | ORAL_TABLET | Freq: Every day | ORAL | 0 refills | Status: DC

## 2016-10-02 MED ORDER — NORETHIN ACE-ETH ESTRAD-FE 1-20 MG-MCG PO TABS: 1.0000 | ORAL_TABLET | Freq: Every day | ORAL | 3 refills | Status: DC

## 2016-12-31 ENCOUNTER — Ambulatory Visit: Payer: Self-pay | Admitting: Obstetrics and Gynecology

## 2017-07-22 ENCOUNTER — Other Ambulatory Visit: Payer: Self-pay | Admitting: Obstetrics and Gynecology

## 2017-07-22 DIAGNOSIS — Z1231 Encounter for screening mammogram for malignant neoplasm of breast: Secondary | ICD-10-CM

## 2017-09-02 ENCOUNTER — Ambulatory Visit
Admission: RE | Admit: 2017-09-02 | Discharge: 2017-09-02 | Disposition: A | Discharge status: Home / Self Care | Payer: BLUE CROSS/BLUE SHIELD | Source: Ambulatory Visit | Attending: Obstetrics and Gynecology | Admitting: Obstetrics and Gynecology

## 2017-09-02 DIAGNOSIS — Z1231 Encounter for screening mammogram for malignant neoplasm of breast: Secondary | ICD-10-CM

## 2017-10-21 ENCOUNTER — Encounter: Payer: Self-pay | Admitting: Obstetrics and Gynecology

## 2017-10-21 ENCOUNTER — Other Ambulatory Visit: Payer: Self-pay

## 2017-10-21 ENCOUNTER — Ambulatory Visit: Payer: BLUE CROSS/BLUE SHIELD | Admitting: Obstetrics and Gynecology

## 2017-10-21 VITALS — BP 90/56 | HR 80 | Resp 14 | Ht 59.5 in | Wt 115.4 lb

## 2017-10-21 DIAGNOSIS — Z01419 Encounter for gynecological examination (general) (routine) without abnormal findings: Secondary | ICD-10-CM

## 2017-10-21 NOTE — Patient Instructions (Signed)

## 2017-10-21 NOTE — Progress Notes (Signed)
46 y.o. G0P0 Single Asian female here for annual exam.    Had pelvic ultrasound for metrorrhagia last year.  Pelvic US showed EMS 6.7 mm and vascular flow.  8 mm fibroid. Normal ovaries.  Sonohysterogram showed no filling defects. EMB showed disordered proliferative endometrium.   Menses skip occasionally.  Had 10 - 11 menses in the last year.  No bleeding In between menses.  No hot flashes.   Wants labs today.  Declines STD testing.  No new partner.  PCP:    None  Patient's last menstrual period was 10/14/2017.     Period Cycle (Days): 3 Period Pattern: (!) Irregular Menstrual Flow: Light Dysmenorrhea: None     Sexually active: Yes.    The current method of family planning is none.   Infertility.   Does not use contraception.  Exercising: No.  The patient does not participate in regular exercise at present. Smoker:  no  Health Maintenance: Pap:  09/17/16 negative, HR HPV negative, 08/08/13 negative, HR HPV negative  History of abnormal Pap:  no MMG:  09/02/17 density D/BIRADS 1 negative- [facility] Colonoscopy:  n/a BMD:   n/a  Result  n/a TDaP:  unsure Gardasil:   no HIV: declines Hep C: not indicated  Screening Labs:  Discuss with provider Hb today: discuss with provider, Urine today: not collected   reports that  has never smoked. she has never used smokeless tobacco. She reports that she does not drink alcohol or use drugs.  Past Medical History:  Diagnosis Date  . Abnormal uterine bleeding   . Elevated cholesterol 11/2011  . Fibroid 09/25/2016   8 mm  . Infertility, female     Past Surgical History:  Procedure Laterality Date  . DILATATION & CURRETTAGE/HYSTEROSCOPY WITH RESECTOCOPE N/A 11/22/2013   Procedure: DILATATION & CURETTAGE/HYSTEROSCOPY WITH RESECTOCOPE;  Surgeon: Jamey Reas de Berton Lan, MD;  Location: [facility];  Service: Gynecology;  Laterality: N/A;    No current outpatient medications on file.   No current  facility-administered medications for this visit.     Family History  Problem Relation Age of Onset  . Diabetes Mother   . Hyperlipidemia Mother   . Hypertension Mother   . Rheum arthritis Mother   . Hyperlipidemia Father     ROS:  Pertinent items are noted in HPI.  Otherwise, a comprehensive ROS was negative.  Exam:   BP (!) 90/56 (BP Location: Right Arm, Patient Position: Sitting, Cuff Size: Normal)   Pulse 80   Resp 14   Ht 4' 11.5" (1.511 m)   Wt 115 lb 6.4 oz (52.3 kg)   LMP 10/14/2017   BMI 22.92 kg/m     General appearance: alert, cooperative and appears stated age Head: Normocephalic, without obvious abnormality, atraumatic Neck: no adenopathy, supple, symmetrical, trachea midline and thyroid normal to inspection and palpation Lungs: clear to auscultation bilaterally Breasts: normal appearance, no masses or tenderness, No nipple retraction or dimpling, No nipple discharge or bleeding, No axillary or supraclavicular adenopathy Heart: regular rate and rhythm Abdomen: soft, non-tender; no masses, no organomegaly Extremities: extremities normal, atraumatic, no cyanosis or edema Skin: Skin color, texture, turgor normal. No rashes or lesions Lymph nodes: Cervical, supraclavicular, and axillary nodes normal. No abnormal inguinal nodes palpated Neurologic: Grossly normal  Pelvic: External genitalia:  no lesions              Urethra:  normal appearing urethra with no masses, tenderness or lesions  Bartholins and Skenes: normal                 Vagina: normal appearing vagina with normal color and discharge, no lesions              Cervix: no lesions              Pap taken: No. Bimanual Exam:  Uterus:  normal size, contour, position, consistency, mobility, non-tender              Adnexa: no mass, fullness, tenderness              Rectal exam: Yes.  .  Confirms.              Anus:  normal sphincter tone, no lesions  Chaperone was present for exam.  Assessment:    Well woman visit with normal exam. Hx infertility.   Plan: Mammogram screening discussed. Recommended self breast awareness. Pap and HR HPV as above. Guidelines for Calcium, Vitamin D, regular exercise program including cardiovascular and weight bearing exercise. Discussed colonoscopy.   She will check wit her insurance.  Routine labs.  Follow up annually and prn.    After visit summary provided.

## 2017-10-22 LAB — CBC
Hematocrit: 43.2 % (ref 34.0–46.6)
Hemoglobin: 14.6 g/dL (ref 11.1–15.9)
MCH: 30.4 pg (ref 26.6–33.0)
MCHC: 33.8 g/dL (ref 31.5–35.7)
MCV: 90 fL (ref 79–97)
PLATELETS: 308 10*3/uL (ref 150–379)
RBC: 4.81 x10E6/uL (ref 3.77–5.28)
RDW: 13 % (ref 12.3–15.4)
WBC: 6.9 10*3/uL (ref 3.4–10.8)

## 2017-10-22 LAB — COMPREHENSIVE METABOLIC PANEL
A/G RATIO: 1.8 (ref 1.2–2.2)
ALT: 16 IU/L (ref 0–32)
AST: 16 IU/L (ref 0–40)
Albumin: 4.7 g/dL (ref 3.5–5.5)
Alkaline Phosphatase: 72 IU/L (ref 39–117)
BUN/Creatinine Ratio: 16 (ref 9–23)
BUN: 10 mg/dL (ref 6–24)
Bilirubin Total: 0.6 mg/dL (ref 0.0–1.2)
CALCIUM: 9.5 mg/dL (ref 8.7–10.2)
CHLORIDE: 102 mmol/L (ref 96–106)
CO2: 24 mmol/L (ref 20–29)
Creatinine, Ser: 0.63 mg/dL (ref 0.57–1.00)
GFR calc Af Amer: 124 mL/min/{1.73_m2} (ref >59)
GFR calc non Af Amer: 107 mL/min/{1.73_m2} (ref >59)
Globulin, Total: 2.6 g/dL (ref 1.5–4.5)
Glucose: 110 mg/dL — ABNORMAL HIGH (ref 65–99)
POTASSIUM: 4.3 mmol/L (ref 3.5–5.2)
SODIUM: 139 mmol/L (ref 134–144)
TOTAL PROTEIN: 7.3 g/dL (ref 6.0–8.5)

## 2017-10-22 LAB — LIPID PANEL
CHOLESTEROL TOTAL: 207 mg/dL — AB (ref 100–199)
Chol/HDL Ratio: 3.1 ratio (ref 0.0–4.4)
HDL: 67 mg/dL (ref >39)
LDL CALC: 124 mg/dL — AB (ref 0–99)
Triglycerides: 78 mg/dL (ref 0–149)
VLDL Cholesterol Cal: 16 mg/dL (ref 5–40)

## 2017-11-18 ENCOUNTER — Other Ambulatory Visit (INDEPENDENT_AMBULATORY_CARE_PROVIDER_SITE_OTHER): Payer: BLUE CROSS/BLUE SHIELD

## 2017-11-18 ENCOUNTER — Other Ambulatory Visit: Payer: Self-pay

## 2017-11-18 DIAGNOSIS — R7309 Other abnormal glucose: Secondary | ICD-10-CM

## 2017-11-19 LAB — HEMOGLOBIN A1C
ESTIMATED AVERAGE GLUCOSE: 108 mg/dL
HEMOGLOBIN A1C: 5.4 % (ref 4.8–5.6)

## 2018-10-04 ENCOUNTER — Other Ambulatory Visit: Payer: Self-pay | Admitting: Obstetrics and Gynecology

## 2018-10-04 DIAGNOSIS — Z1231 Encounter for screening mammogram for malignant neoplasm of breast: Secondary | ICD-10-CM

## 2018-11-01 NOTE — Progress Notes (Signed)
47 y.o. G0P0 Single Asian female here for annual exam.    Menses once a month.   Family moved here from [place].  Father still there and not able to travel.   Wants blood work today.  PCP:   None  Patient's last menstrual period was 10/25/2018 (exact date).     Period Cycle (Days): (may skip cycles) Period Duration (Days): 2-3 days Period Pattern: (!) Irregular Menstrual Flow: Light Menstrual Control: Panty liner Dysmenorrhea: None     Sexually active: Yes.   female The current method of family planning is none.    Exercising: No.  The patient does not participate in regular exercise at present. Smoker:  no  Health Maintenance: Pap:  09/17/16 negative, HR HPV negative, 08/08/13 negative, HR HPV negative  History of abnormal Pap:  no MMG: 09-02-17 Neg/density D/BiRads1.  Has appt 11/16/18.  Colonoscopy:  n/a BMD:   n/a  Result  n/a TDaP: Unsure--?10 years Gardasil:   no HIV:no Hep C:no Screening Labs:      reports that she has never smoked. She has never used smokeless tobacco. She reports that she does not drink alcohol or use drugs.  Past Medical History:  Diagnosis Date  . Abnormal uterine bleeding   . Elevated cholesterol 11/2011  . Fibroid 09/25/2016   8 mm  . Infertility, female     Past Surgical History:  Procedure Laterality Date  . DILATATION & CURRETTAGE/HYSTEROSCOPY WITH RESECTOCOPE N/A 11/22/2013   Procedure: DILATATION & CURETTAGE/HYSTEROSCOPY WITH RESECTOCOPE;  Surgeon: Jamey Reas de Berton Lan, MD;  Location: [facility];  Service: Gynecology;  Laterality: N/A;    No current outpatient medications on file.   No current facility-administered medications for this visit.     Family History  Problem Relation Age of Onset  . Diabetes Mother   . Hyperlipidemia Mother   . Hypertension Mother   . Rheum arthritis Mother   . Hyperlipidemia Father     Review of Systems  All other systems reviewed and are negative.   Exam:   BP (!) 100/58  (BP Location: Right Arm, Patient Position: Sitting, Cuff Size: Normal)   Pulse 70   Resp 14   Ht 4\' 11"  (1.499 m)   Wt 120 lb 6.4 oz (54.6 kg)   LMP 10/25/2018 (Exact Date)   BMI 24.32 kg/m     General appearance: alert, cooperative and appears stated age Head: Normocephalic, without obvious abnormality, atraumatic Neck: no adenopathy, supple, symmetrical, trachea midline and thyroid normal to inspection and palpation Lungs: clear to auscultation bilaterally Breasts: normal appearance, no masses or tenderness, No nipple retraction or dimpling, No nipple discharge or bleeding, No axillary or supraclavicular adenopathy Heart: regular rate and rhythm Abdomen: soft, non-tender; no masses, no organomegaly Extremities: extremities normal, atraumatic, no cyanosis or edema Skin: Skin color, texture, turgor normal. No rashes or lesions Lymph nodes: Cervical, supraclavicular, and axillary nodes normal. No abnormal inguinal nodes palpated Neurologic: Grossly normal  Pelvic: External genitalia:  no lesions              Urethra:  normal appearing urethra with no masses, tenderness or lesions              Bartholins and Skenes: normal                 Vagina: normal appearing vagina with normal color and discharge, no lesions              Cervix: no lesions  Pap taken: No. Bimanual Exam:  Uterus:  normal size, contour, position, consistency, mobility, non-tender              Adnexa: no mass, fullness, tenderness              Rectal exam: Yes.  .  Confirms.              Anus:  normal sphincter tone, no lesions  Chaperone was present for exam.  Assessment:   Well woman visit with normal exam. Dense breast tissue.  FH diabetes.   Plan: Mammogram screening. Recommended self breast awareness. Pap and HR HPV as above. Guidelines for Calcium, Vitamin D, regular exercise program including cardiovascular and weight bearing exercise. Flu vaccine recommended.  TDap today. IFOB and  routine labs including A1C.   Follow up annually and prn.   After visit summary provided.

## 2018-11-03 ENCOUNTER — Encounter: Payer: Self-pay | Admitting: Obstetrics and Gynecology

## 2018-11-03 ENCOUNTER — Other Ambulatory Visit: Payer: Self-pay

## 2018-11-03 ENCOUNTER — Ambulatory Visit (INDEPENDENT_AMBULATORY_CARE_PROVIDER_SITE_OTHER): Payer: BLUE CROSS/BLUE SHIELD | Admitting: Obstetrics and Gynecology

## 2018-11-03 VITALS — BP 100/58 | HR 70 | Resp 14 | Ht 59.0 in | Wt 120.4 lb

## 2018-11-03 DIAGNOSIS — Z01419 Encounter for gynecological examination (general) (routine) without abnormal findings: Secondary | ICD-10-CM

## 2018-11-03 DIAGNOSIS — Z23 Encounter for immunization: Secondary | ICD-10-CM

## 2018-11-03 DIAGNOSIS — Z1211 Encounter for screening for malignant neoplasm of colon: Secondary | ICD-10-CM

## 2018-11-03 NOTE — Patient Instructions (Signed)

## 2018-11-04 LAB — LIPID PANEL
CHOL/HDL RATIO: 3.6 ratio (ref 0.0–4.4)
Cholesterol, Total: 232 mg/dL — ABNORMAL HIGH (ref 100–199)
HDL: 65 mg/dL (ref >39)
LDL Calculated: 145 mg/dL — ABNORMAL HIGH (ref 0–99)
Triglycerides: 108 mg/dL (ref 0–149)
VLDL Cholesterol Cal: 22 mg/dL (ref 5–40)

## 2018-11-04 LAB — COMPREHENSIVE METABOLIC PANEL
ALBUMIN: 4.7 g/dL (ref 3.5–5.5)
ALK PHOS: 64 IU/L (ref 39–117)
ALT: 20 IU/L (ref 0–32)
AST: 29 IU/L (ref 0–40)
Albumin/Globulin Ratio: 1.9 (ref 1.2–2.2)
BUN/Creatinine Ratio: 20 (ref 9–23)
BUN: 13 mg/dL (ref 6–24)
Bilirubin Total: 0.5 mg/dL (ref 0.0–1.2)
CHLORIDE: 109 mmol/L — AB (ref 96–106)
CO2: 15 mmol/L — ABNORMAL LOW (ref 20–29)
Calcium: 9.3 mg/dL (ref 8.7–10.2)
Creatinine, Ser: 0.66 mg/dL (ref 0.57–1.00)
GFR calc Af Amer: 121 mL/min/{1.73_m2} (ref >59)
GFR, EST NON AFRICAN AMERICAN: 105 mL/min/{1.73_m2} (ref >59)
Globulin, Total: 2.5 g/dL (ref 1.5–4.5)
Sodium: 143 mmol/L (ref 134–144)
Total Protein: 7.2 g/dL (ref 6.0–8.5)

## 2018-11-04 LAB — HEMOGLOBIN A1C
ESTIMATED AVERAGE GLUCOSE: 114 mg/dL
HEMOGLOBIN A1C: 5.6 % (ref 4.8–5.6)

## 2018-11-04 LAB — CBC
HEMATOCRIT: 46.2 % (ref 34.0–46.6)
Hemoglobin: 14.8 g/dL (ref 11.1–15.9)
MCH: 29.6 pg (ref 26.6–33.0)
MCHC: 32 g/dL (ref 31.5–35.7)
MCV: 92 fL (ref 79–97)
Platelets: 246 10*3/uL (ref 150–450)
RBC: 5 x10E6/uL (ref 3.77–5.28)
RDW: 12.1 % — ABNORMAL LOW (ref 12.3–15.4)
WBC: 9 10*3/uL (ref 3.4–10.8)

## 2018-11-10 LAB — FECAL OCCULT BLOOD, IMMUNOCHEMICAL: FECAL OCCULT BLD: NEGATIVE

## 2018-11-16 ENCOUNTER — Ambulatory Visit
Admission: RE | Admit: 2018-11-16 | Discharge: 2018-11-16 | Disposition: A | Discharge status: Home / Self Care | Payer: BLUE CROSS/BLUE SHIELD | Source: Ambulatory Visit | Attending: Obstetrics and Gynecology | Admitting: Obstetrics and Gynecology

## 2018-11-16 DIAGNOSIS — Z1231 Encounter for screening mammogram for malignant neoplasm of breast: Secondary | ICD-10-CM

## 2018-11-18 ENCOUNTER — Other Ambulatory Visit: Payer: Self-pay | Admitting: Obstetrics and Gynecology

## 2018-11-18 DIAGNOSIS — R928 Other abnormal and inconclusive findings on diagnostic imaging of breast: Secondary | ICD-10-CM

## 2018-11-25 ENCOUNTER — Other Ambulatory Visit: Payer: BLUE CROSS/BLUE SHIELD

## 2018-12-08 ENCOUNTER — Telehealth: Payer: Self-pay | Admitting: Emergency Medicine

## 2018-12-08 DIAGNOSIS — R928 Other abnormal and inconclusive findings on diagnostic imaging of breast: Secondary | ICD-10-CM

## 2018-12-08 NOTE — Telephone Encounter (Signed)
Call to patient.  She had a screening mammogram on 11/16/2018.  Recalled for possible left breast masses and R breast calcifications.

## 2018-12-16 ENCOUNTER — Encounter: Payer: Self-pay | Admitting: Obstetrics and Gynecology

## 2018-12-16 NOTE — Telephone Encounter (Signed)
Patient sent the following correspondence through [place]. Routing to triage to assist patient with request.  Hi Doctor .How are you doing. I love and miss you so much  Im sorry I cant come to see you this year because I changed my insurance company.now I need your help .The Mammogram in [facility] they need your orders for me to do the diagnostic and ultrasound so they can setup for me to do .please Doctor. Thanks for your help.Have a wonderful day Doctor Leatha Gilding only: I've confirmed the patient does have a new health insurance on file that requires her to use another health system for care.

## 2018-12-17 NOTE — Telephone Encounter (Signed)
Orders signed by Dr. Quincy Simmonds and faxed to [facility] at [facility] in [facility].  Responded to patient via mychart.  Continued hold for diagnostic results.  Encounter to Dr.  Quincy Simmonds and will close.

## 2018-12-17 NOTE — Telephone Encounter (Signed)
Order to Dr. Quincy Simmonds to review and sign.  Screening at [facility] imaging.   Now needs dx imaging at [facility] due to insurance change.

## 2018-12-20 ENCOUNTER — Telehealth: Payer: Self-pay | Admitting: Obstetrics and Gynecology

## 2018-12-20 ENCOUNTER — Encounter: Payer: Self-pay | Admitting: Obstetrics and Gynecology

## 2018-12-20 NOTE — Telephone Encounter (Signed)
Patient sent the following correspondence through [place]. Routing to triage to assist patient with request.  Good morning Olivia Mackie ,Thanks so much for your help .I love you all.have a good weekend  ----- Message -----  From: Nurse Kallie Edward  Sent: 12/17/2018 4:24 PM EST  To: Alda Lea  Subject: RE: Visit Follow-Up Question  Tomesha,     We have sent the orders for you to [facility]. They should be contacting you to schedule your follow up diagnostic imaging and ultrasound. We will wait for the results. If they do not contact you, please call them at [phone]. We are here to help! Let us know if you need anything.     Sincerely,   Edrick Kins , BSN RN-BC  Triage Nurse      ----- Message -----   From: Alda Lea   Sent: 12/16/2018 4:20 PM EST    To: Arloa Koh, MD  Subject: Visit Follow-Up Question    Hi Doctor .How are you doing. I love and miss you so much  Im sorry I cant come to see you this year because I changed my insurance company.now I need your help .The Mammogram in [facility] they need your orders for me to do the diagnostic and ultrasound so they can setup for me to do .please Doctor. Thanks for your help.Have a wonderful day Doctor [employer]

## 2018-12-20 NOTE — Telephone Encounter (Signed)
Order faxed to [facility] imaging at fax number below.   Called to confirm receipt.  Receipt confirmed. They will contact patient to schedule.   Encounter to Dr. Quincy Simmonds and will close.   Notified patient via mychart.

## 2018-12-20 NOTE — Telephone Encounter (Signed)
Patient sent the following correspondence through [place]. Routing to triage to assist patient with request.  Good morning Doctor   I just called to make a appointment for my ultrasound Mamogram but they said they didnt to see your order yet so they can not scheduled it for me so please can you do it for me again to fax number [phone] . Thanks so much and have a wonderful day   Last seen: 11/03/18

## 2018-12-21 ENCOUNTER — Telehealth: Payer: Self-pay | Admitting: Obstetrics and Gynecology

## 2018-12-21 NOTE — Telephone Encounter (Signed)
[facility] at [facility] calling regarding patient. States they have all images needed for patient, but they need an order from Dr. Quincy Simmonds for bilateral diagnostic.

## 2018-12-21 NOTE — Telephone Encounter (Signed)
[facility]axed orders to [facility].  Confirmed fax.  Orders sent.  Encounter closed.

## 2018-12-24 ENCOUNTER — Telehealth: Payer: Self-pay | Admitting: Obstetrics and Gynecology

## 2018-12-24 NOTE — Telephone Encounter (Signed)
Spoke with patient in regards to order for imaging. Patient is aware she will need to get a CD of her previous mammograms. Patient to call the place where she had the imaging done to request a CD for [facility] in [facility].

## 2018-12-29 ENCOUNTER — Encounter: Payer: Self-pay | Admitting: Obstetrics and Gynecology

## 2020-10-06 IMAGING — MG DIGITAL SCREENING BILATERAL MAMMOGRAM WITH TOMO AND CAD
8 series · 9 of 24 positions shown · non-contrast
Comparison: Previous exam(s).

CLINICAL DATA: Screening.

EXAM:
DIGITAL SCREENING BILATERAL MAMMOGRAM WITH TOMO AND CAD

[L MLO synth-2D]
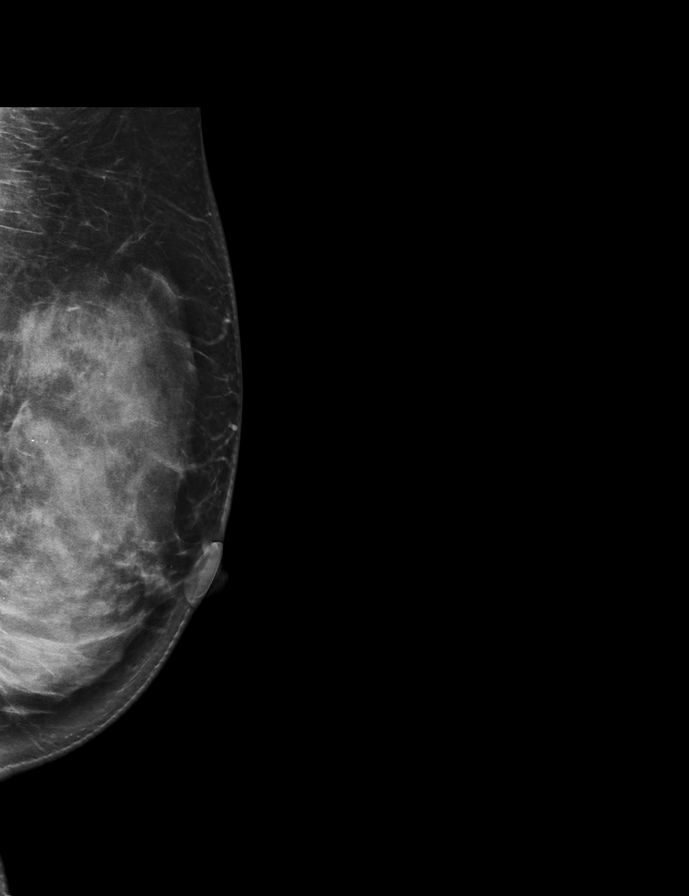

[L CC synth-2D]
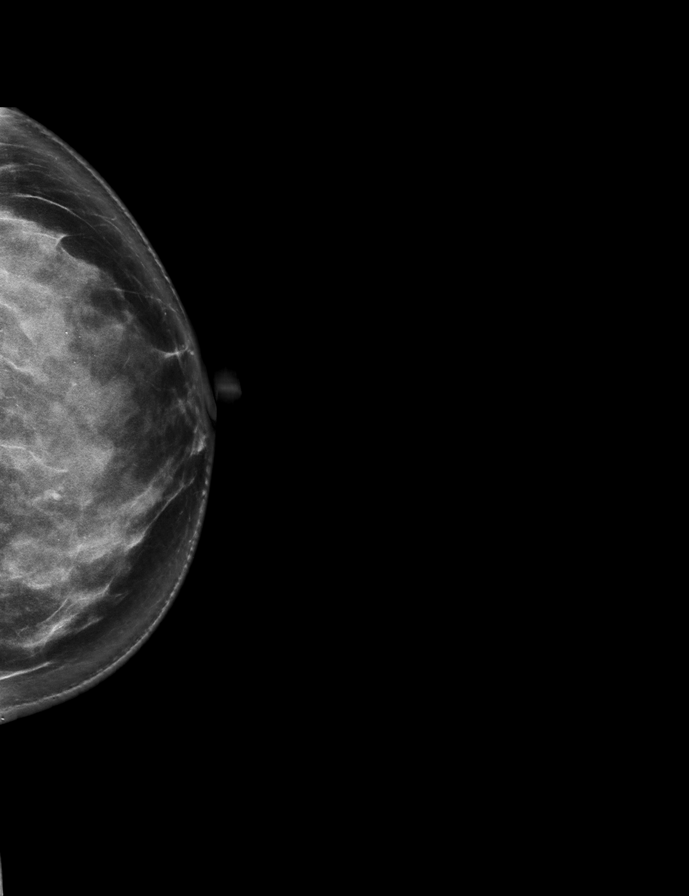

[R CC synth-2D]
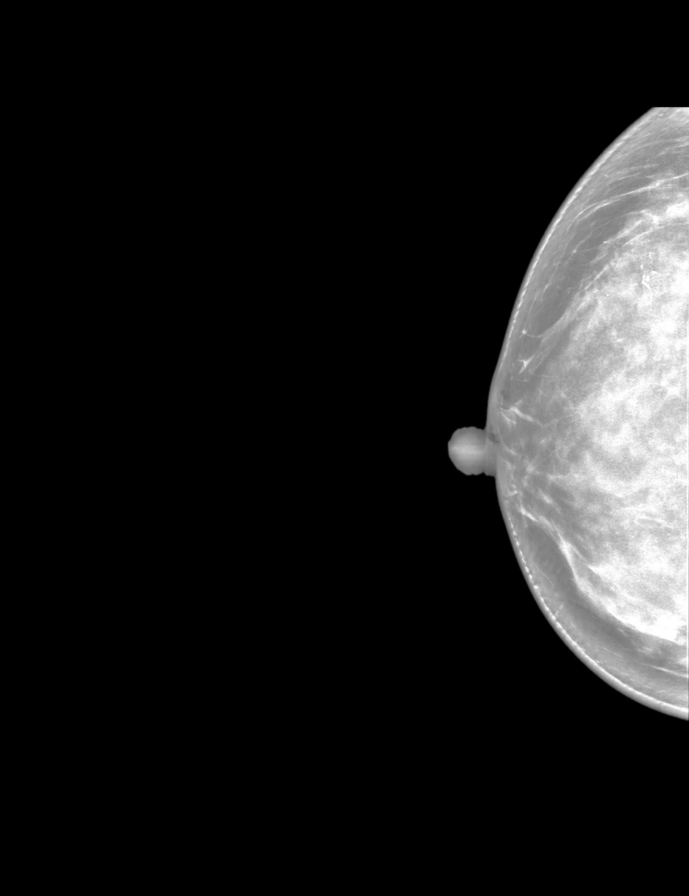

[R MLO synth-2D]
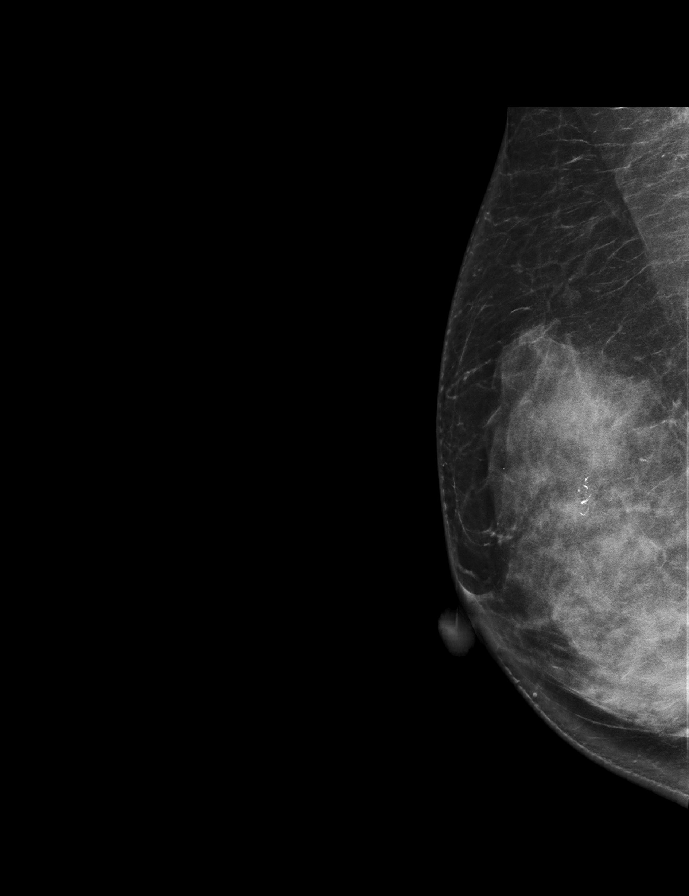

[L MLO tomo · 2 of 77 frames shown]
[frame 25/77]
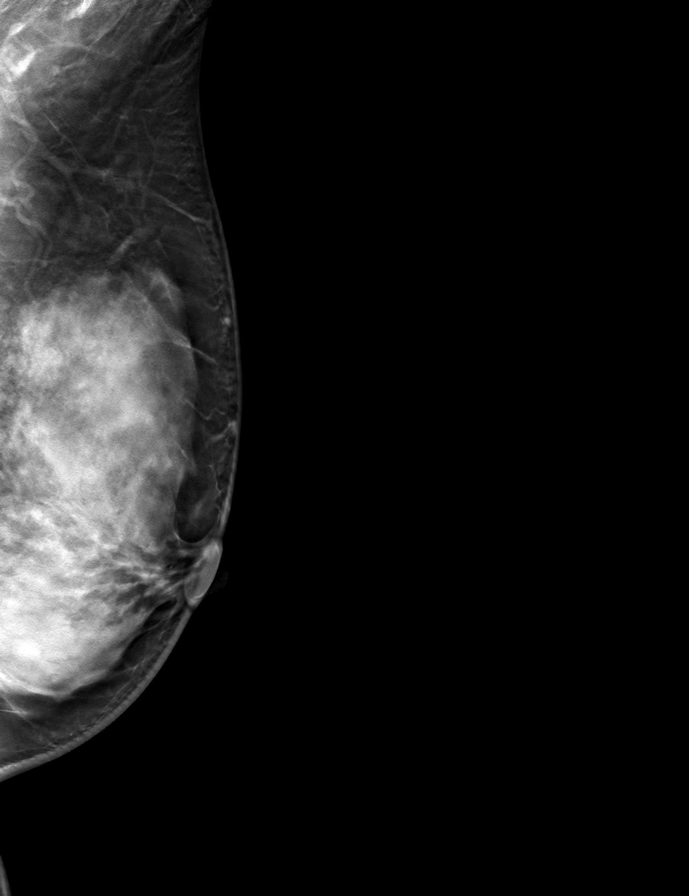
[frame 39/77]
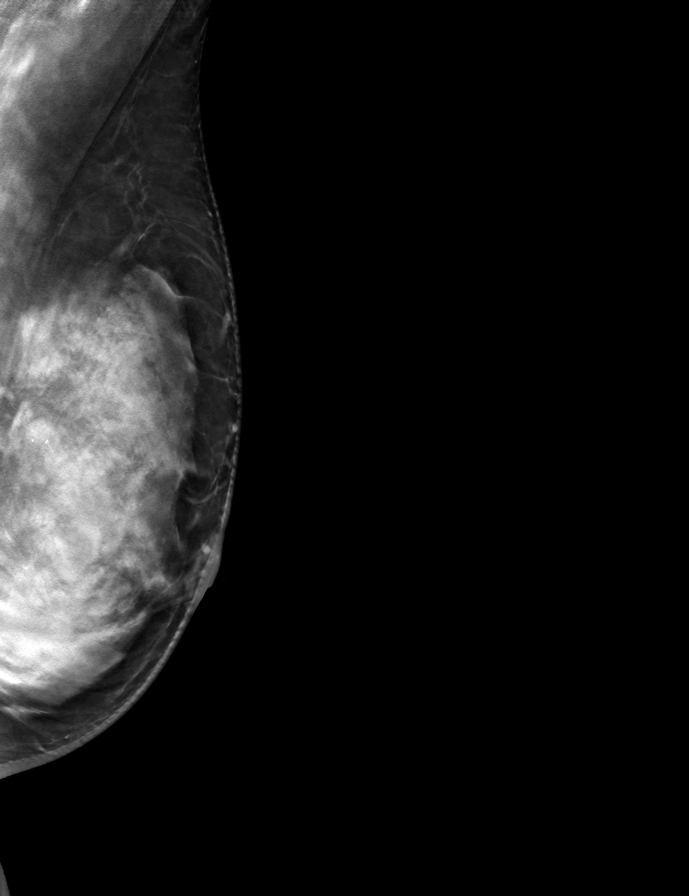

[R CC tomo · tomo slice 37/72.0]
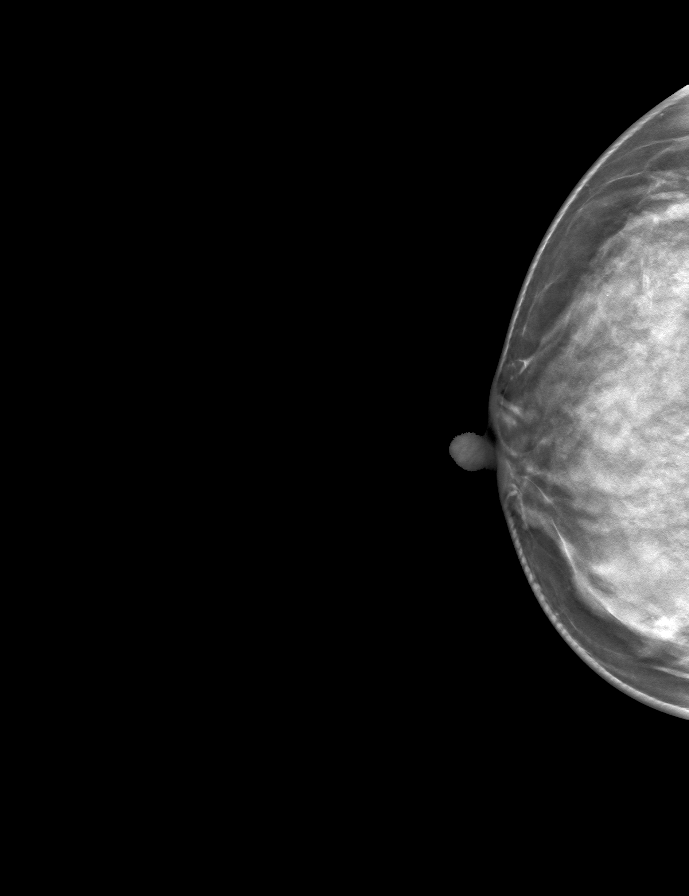

[L CC tomo · tomo slice 39/77.0]
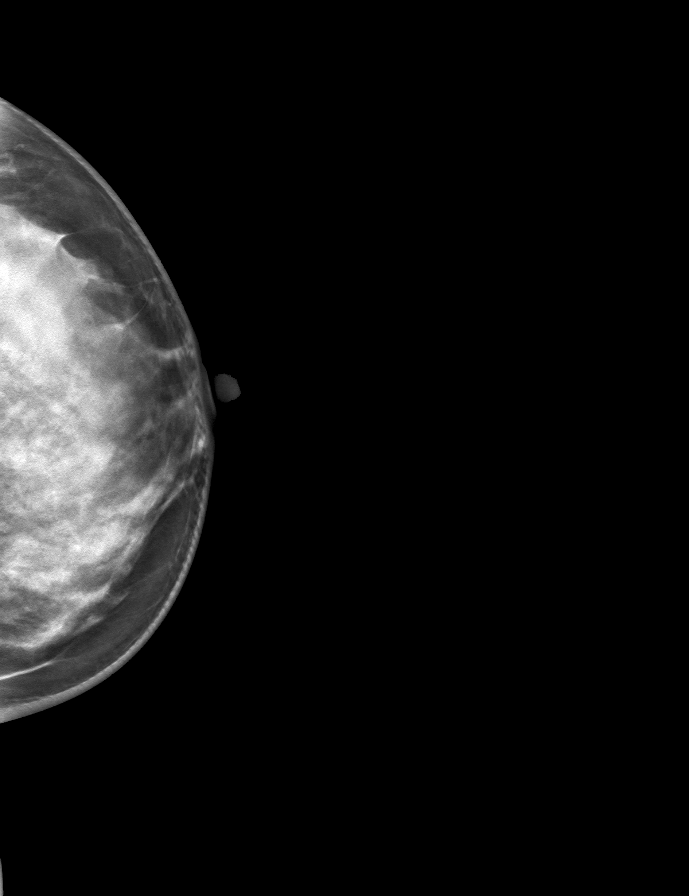

[R MLO tomo · tomo slice 39/78.0]
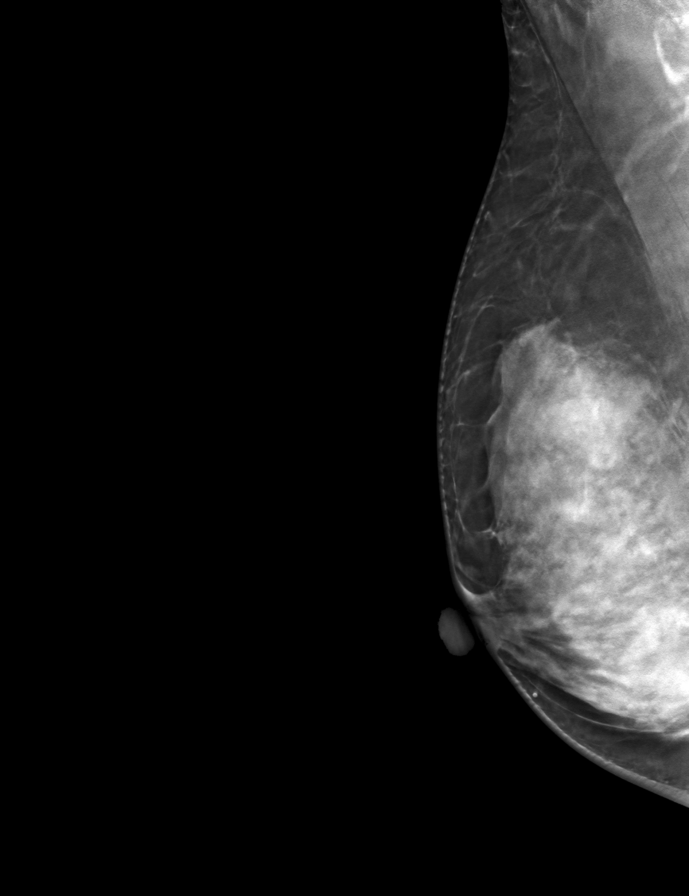

[9 of 24 positions shown; findings below may reference images not displayed]

ACR Breast Density Category c: The breast tissue is heterogeneously
dense, which may obscure small masses.
FINDINGS: In the right breast calcifications require further evaluation.

In the left breast masses and calcifications require further
evaluation.

Images were processed with CAD.
IMPRESSION: Further evaluation is suggested for possible calcifications in the
right breast.

Further evaluation is suggested for possible masses and
calcifications in the left breast.

RECOMMENDATION:
Diagnostic mammogram and possibly ultrasound of both breasts.
(Code:[id number])

The patient will be contacted regarding the findings, and additional
imaging will be scheduled.

BI-RADS CATEGORY  0: Incomplete. Need additional imaging evaluation
and/or prior mammograms for comparison.

## 2023-10-19 ENCOUNTER — Ambulatory Visit: Payer: Self-pay | Admitting: Family Medicine

## 2023-10-27 ENCOUNTER — Ambulatory Visit: Payer: Self-pay | Admitting: Family Medicine
